# Patient Record
Sex: Female | Born: 1940 | Race: White | Hispanic: No | Smoking: Former smoker
Health system: Southern US, Community
[De-identification: ages and names within clinical notes are randomized; demographics above are authoritative.]

## PROBLEM LIST (undated history)

## (undated) DIAGNOSIS — F32A Depression, unspecified: Secondary | ICD-10-CM

## (undated) DIAGNOSIS — A4902 Methicillin resistant Staphylococcus aureus infection, unspecified site: Secondary | ICD-10-CM

## (undated) DIAGNOSIS — I219 Acute myocardial infarction, unspecified: Secondary | ICD-10-CM

## (undated) DIAGNOSIS — J449 Chronic obstructive pulmonary disease, unspecified: Secondary | ICD-10-CM

## (undated) DIAGNOSIS — I10 Essential (primary) hypertension: Secondary | ICD-10-CM

## (undated) DIAGNOSIS — K635 Polyp of colon: Secondary | ICD-10-CM

## (undated) DIAGNOSIS — C50919 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: Secondary | ICD-10-CM

## (undated) DIAGNOSIS — E785 Hyperlipidemia, unspecified: Secondary | ICD-10-CM

## (undated) DIAGNOSIS — N289 Disorder of kidney and ureter, unspecified: Secondary | ICD-10-CM

## (undated) DIAGNOSIS — I82409 Acute embolism and thrombosis of unspecified deep veins of unspecified lower extremity: Secondary | ICD-10-CM

## (undated) DIAGNOSIS — F419 Anxiety disorder, unspecified: Secondary | ICD-10-CM

## (undated) DIAGNOSIS — J849 Interstitial pulmonary disease, unspecified: Secondary | ICD-10-CM

## (undated) HISTORY — DX: Interstitial pulmonary disease, unspecified: J84.9

## (undated) HISTORY — PX: ABDOMINAL HYSTERECTOMY: SHX81

## (undated) HISTORY — PX: CHOLECYSTECTOMY: SHX55

## (undated) HISTORY — DX: Hyperlipidemia, unspecified: E78.5

## (undated) HISTORY — DX: 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: C50.919

## (undated) HISTORY — DX: Acute embolism and thrombosis of unspecified deep veins of unspecified lower extremity: I82.409

## (undated) HISTORY — DX: Anxiety disorder, unspecified: F41.9

## (undated) HISTORY — DX: Depression, unspecified: F32.A

## (undated) HISTORY — DX: Chronic obstructive pulmonary disease, unspecified: J44.9

## (undated) HISTORY — PX: APPENDECTOMY: SHX54

## (undated) HISTORY — PX: BREAST LUMPECTOMY: SHX2

## (undated) HISTORY — DX: Disorder of kidney and ureter, unspecified: N28.9

## (undated) HISTORY — DX: Polyp of colon: K63.5

## (undated) HISTORY — DX: Acute myocardial infarction, unspecified: I21.9

## (undated) HISTORY — DX: Essential (primary) hypertension: I10

---

## 2011-09-23 DIAGNOSIS — Z853 Personal history of malignant neoplasm of breast: Secondary | ICD-10-CM | POA: Insufficient documentation

## 2011-09-23 HISTORY — DX: Personal history of malignant neoplasm of breast: Z85.3

## 2012-08-09 DIAGNOSIS — C50919 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: Secondary | ICD-10-CM

## 2012-08-09 HISTORY — DX: 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: C50.919

## 2012-10-22 DIAGNOSIS — M1990 Unspecified osteoarthritis, unspecified site: Secondary | ICD-10-CM | POA: Insufficient documentation

## 2012-10-22 DIAGNOSIS — I10 Essential (primary) hypertension: Secondary | ICD-10-CM | POA: Insufficient documentation

## 2012-10-22 DIAGNOSIS — F09 Unspecified mental disorder due to known physiological condition: Secondary | ICD-10-CM

## 2012-10-22 DIAGNOSIS — E785 Hyperlipidemia, unspecified: Secondary | ICD-10-CM | POA: Insufficient documentation

## 2012-10-22 DIAGNOSIS — K219 Gastro-esophageal reflux disease without esophagitis: Secondary | ICD-10-CM

## 2012-10-22 DIAGNOSIS — R42 Dizziness and giddiness: Secondary | ICD-10-CM | POA: Insufficient documentation

## 2012-10-22 HISTORY — DX: Essential (primary) hypertension: I10

## 2012-10-22 HISTORY — DX: Dizziness and giddiness: R42

## 2012-10-22 HISTORY — DX: Unspecified osteoarthritis, unspecified site: M19.90

## 2012-10-22 HISTORY — DX: Gastro-esophageal reflux disease without esophagitis: K21.9

## 2012-10-22 HISTORY — DX: Unspecified mental disorder due to known physiological condition: F09

## 2012-11-15 DIAGNOSIS — F411 Generalized anxiety disorder: Secondary | ICD-10-CM

## 2012-11-15 HISTORY — DX: Generalized anxiety disorder: F41.1

## 2013-01-11 DIAGNOSIS — R9389 Abnormal findings on diagnostic imaging of other specified body structures: Secondary | ICD-10-CM | POA: Insufficient documentation

## 2013-02-23 DIAGNOSIS — Z889 Allergy status to unspecified drugs, medicaments and biological substances status: Secondary | ICD-10-CM | POA: Insufficient documentation

## 2013-02-23 HISTORY — DX: Allergy status to unspecified drugs, medicaments and biological substances: Z88.9

## 2013-03-02 DIAGNOSIS — J4530 Mild persistent asthma, uncomplicated: Secondary | ICD-10-CM

## 2013-03-02 DIAGNOSIS — J479 Bronchiectasis, uncomplicated: Secondary | ICD-10-CM | POA: Insufficient documentation

## 2013-03-02 DIAGNOSIS — R918 Other nonspecific abnormal finding of lung field: Secondary | ICD-10-CM | POA: Insufficient documentation

## 2013-03-02 DIAGNOSIS — J4540 Moderate persistent asthma, uncomplicated: Secondary | ICD-10-CM | POA: Insufficient documentation

## 2013-03-02 HISTORY — DX: Mild persistent asthma, uncomplicated: J45.30

## 2013-03-02 HISTORY — DX: Other nonspecific abnormal finding of lung field: R91.8

## 2014-02-13 DIAGNOSIS — J383 Other diseases of vocal cords: Secondary | ICD-10-CM | POA: Insufficient documentation

## 2015-05-06 DIAGNOSIS — A6000 Herpesviral infection of urogenital system, unspecified: Secondary | ICD-10-CM

## 2015-05-06 HISTORY — DX: Herpesviral infection of urogenital system, unspecified: A60.00

## 2015-10-08 DIAGNOSIS — N3946 Mixed incontinence: Secondary | ICD-10-CM

## 2015-10-08 HISTORY — DX: Mixed incontinence: N39.46

## 2015-11-07 DIAGNOSIS — R918 Other nonspecific abnormal finding of lung field: Secondary | ICD-10-CM | POA: Insufficient documentation

## 2015-11-07 DIAGNOSIS — J4540 Moderate persistent asthma, uncomplicated: Secondary | ICD-10-CM | POA: Insufficient documentation

## 2016-06-25 DIAGNOSIS — H2512 Age-related nuclear cataract, left eye: Secondary | ICD-10-CM

## 2016-06-25 HISTORY — DX: Age-related nuclear cataract, left eye: H25.12

## 2016-11-19 DIAGNOSIS — Z87440 Personal history of urinary (tract) infections: Secondary | ICD-10-CM | POA: Insufficient documentation

## 2016-11-19 HISTORY — DX: Personal history of urinary (tract) infections: Z87.440

## 2017-01-07 DIAGNOSIS — R296 Repeated falls: Secondary | ICD-10-CM

## 2017-01-07 HISTORY — DX: Repeated falls: R29.6

## 2017-01-13 DIAGNOSIS — Z860100 Personal history of colon polyps, unspecified: Secondary | ICD-10-CM

## 2017-01-13 HISTORY — DX: Personal history of colonic polyps: Z86.010

## 2017-01-13 HISTORY — DX: Personal history of colon polyps, unspecified: Z86.0100

## 2017-02-09 DIAGNOSIS — R350 Frequency of micturition: Secondary | ICD-10-CM | POA: Insufficient documentation

## 2017-02-09 DIAGNOSIS — N816 Rectocele: Secondary | ICD-10-CM | POA: Insufficient documentation

## 2017-02-09 DIAGNOSIS — Z90710 Acquired absence of both cervix and uterus: Secondary | ICD-10-CM

## 2017-02-09 HISTORY — DX: Acquired absence of both cervix and uterus: Z90.710

## 2017-03-04 DIAGNOSIS — G459 Transient cerebral ischemic attack, unspecified: Secondary | ICD-10-CM

## 2017-03-04 DIAGNOSIS — F339 Major depressive disorder, recurrent, unspecified: Secondary | ICD-10-CM | POA: Insufficient documentation

## 2017-03-04 HISTORY — DX: Transient cerebral ischemic attack, unspecified: G45.9

## 2017-05-17 DIAGNOSIS — T887XXA Unspecified adverse effect of drug or medicament, initial encounter: Secondary | ICD-10-CM | POA: Insufficient documentation

## 2017-05-17 HISTORY — DX: Unspecified adverse effect of drug or medicament, initial encounter: T88.7XXA

## 2017-07-21 DIAGNOSIS — Z789 Other specified health status: Secondary | ICD-10-CM | POA: Insufficient documentation

## 2017-07-21 HISTORY — DX: Other specified health status: Z78.9

## 2017-08-24 DIAGNOSIS — E538 Deficiency of other specified B group vitamins: Secondary | ICD-10-CM | POA: Insufficient documentation

## 2017-08-24 HISTORY — DX: Deficiency of other specified B group vitamins: E53.8

## 2017-09-22 DIAGNOSIS — I447 Left bundle-branch block, unspecified: Secondary | ICD-10-CM | POA: Insufficient documentation

## 2017-09-22 HISTORY — DX: Left bundle-branch block, unspecified: I44.7

## 2017-10-04 DIAGNOSIS — R2232 Localized swelling, mass and lump, left upper limb: Secondary | ICD-10-CM | POA: Insufficient documentation

## 2018-01-18 DIAGNOSIS — L989 Disorder of the skin and subcutaneous tissue, unspecified: Secondary | ICD-10-CM | POA: Insufficient documentation

## 2018-05-16 DIAGNOSIS — N301 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: Secondary | ICD-10-CM | POA: Insufficient documentation

## 2018-05-16 HISTORY — DX: 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: N30.10

## 2018-07-14 DIAGNOSIS — R270 Ataxia, unspecified: Secondary | ICD-10-CM | POA: Insufficient documentation

## 2018-12-14 DIAGNOSIS — F4321 Adjustment disorder with depressed mood: Secondary | ICD-10-CM | POA: Insufficient documentation

## 2019-09-20 DIAGNOSIS — K469 Unspecified abdominal hernia without obstruction or gangrene: Secondary | ICD-10-CM

## 2019-09-20 DIAGNOSIS — N819 Female genital prolapse, unspecified: Secondary | ICD-10-CM | POA: Insufficient documentation

## 2019-09-20 HISTORY — DX: Unspecified abdominal hernia without obstruction or gangrene: K46.9

## 2020-01-18 DIAGNOSIS — F330 Major depressive disorder, recurrent, mild: Secondary | ICD-10-CM

## 2020-01-18 DIAGNOSIS — F451 Undifferentiated somatoform disorder: Secondary | ICD-10-CM | POA: Insufficient documentation

## 2020-01-18 HISTORY — DX: Major depressive disorder, recurrent, mild: F33.0

## 2020-01-30 DIAGNOSIS — G609 Hereditary and idiopathic neuropathy, unspecified: Secondary | ICD-10-CM | POA: Insufficient documentation

## 2020-01-30 DIAGNOSIS — Z23 Encounter for immunization: Secondary | ICD-10-CM | POA: Insufficient documentation

## 2020-01-30 HISTORY — DX: Hereditary and idiopathic neuropathy, unspecified: G60.9

## 2020-04-08 ENCOUNTER — Ambulatory Visit (INDEPENDENT_AMBULATORY_CARE_PROVIDER_SITE_OTHER): Payer: Medicare Other | Admitting: Medical-Surgical

## 2020-04-08 ENCOUNTER — Encounter: Payer: Self-pay | Admitting: Medical-Surgical

## 2020-04-08 VITALS — BP 111/71 | HR 77 | Temp 98.3°F | Ht 68.0 in | Wt 176.8 lb

## 2020-04-08 DIAGNOSIS — Z1159 Encounter for screening for other viral diseases: Secondary | ICD-10-CM | POA: Diagnosis not present

## 2020-04-08 DIAGNOSIS — Z7689 Persons encountering health services in other specified circumstances: Secondary | ICD-10-CM

## 2020-04-08 DIAGNOSIS — E538 Deficiency of other specified B group vitamins: Secondary | ICD-10-CM

## 2020-04-08 DIAGNOSIS — I499 Cardiac arrhythmia, unspecified: Secondary | ICD-10-CM

## 2020-04-08 DIAGNOSIS — Z1329 Encounter for screening for other suspected endocrine disorder: Secondary | ICD-10-CM

## 2020-04-08 DIAGNOSIS — E785 Hyperlipidemia, unspecified: Secondary | ICD-10-CM

## 2020-04-08 DIAGNOSIS — F419 Anxiety disorder, unspecified: Secondary | ICD-10-CM | POA: Insufficient documentation

## 2020-04-08 DIAGNOSIS — R7303 Prediabetes: Secondary | ICD-10-CM

## 2020-04-08 DIAGNOSIS — F32A Depression, unspecified: Secondary | ICD-10-CM

## 2020-04-08 DIAGNOSIS — I10 Essential (primary) hypertension: Secondary | ICD-10-CM

## 2020-04-08 DIAGNOSIS — F411 Generalized anxiety disorder: Secondary | ICD-10-CM

## 2020-04-08 DIAGNOSIS — Z8619 Personal history of other infectious and parasitic diseases: Secondary | ICD-10-CM

## 2020-04-08 DIAGNOSIS — R300 Dysuria: Secondary | ICD-10-CM | POA: Diagnosis not present

## 2020-04-08 DIAGNOSIS — Z789 Other specified health status: Secondary | ICD-10-CM

## 2020-04-08 DIAGNOSIS — R296 Repeated falls: Secondary | ICD-10-CM

## 2020-04-08 HISTORY — DX: Personal history of other infectious and parasitic diseases: Z86.19

## 2020-04-08 HISTORY — DX: Cardiac arrhythmia, unspecified: I49.9

## 2020-04-08 HISTORY — DX: Anxiety disorder, unspecified: F41.9

## 2020-04-08 HISTORY — DX: Depression, unspecified: F32.A

## 2020-04-08 HISTORY — DX: Prediabetes: R73.03

## 2020-04-08 LAB — POCT URINALYSIS DIP (CLINITEK)
Bilirubin, UA: NEGATIVE
Blood, UA: NEGATIVE
Glucose, UA: NEGATIVE mg/dL
Ketones, POC UA: NEGATIVE mg/dL
Nitrite, UA: NEGATIVE
POC PROTEIN,UA: NEGATIVE
Spec Grav, UA: >=1.03 — AB (ref 1.010–1.025)
Urobilinogen, UA: 0.2 E.U./dL
pH, UA: 5.5 (ref 5.0–8.0)

## 2020-04-08 MED ORDER — OMEPRAZOLE 40 MG PO CPDR: 40.0000 mg | DELAYED_RELEASE_CAPSULE | Freq: Every day | ORAL | 1 refills | Status: DC

## 2020-04-08 MED ORDER — AMLODIPINE BESYLATE 2.5 MG PO TABS: 2.5000 mg | ORAL_TABLET | Freq: Every day | ORAL | 1 refills | Status: DC

## 2020-04-08 MED ORDER — ALBUTEROL SULFATE HFA 108 (90 BASE) MCG/ACT IN AERS: 2.0000 | INHALATION_SPRAY | RESPIRATORY_TRACT | 5 refills | Status: DC | PRN

## 2020-04-08 NOTE — Progress Notes (Addendum)
New Patient Office Visit  Subjective:  Patient ID: Courtney Cummings, female    DOB: 1941/04/23  Age: 79 y.o. MRN: [id number]  CC:  Chief Complaint  Patient presents with  . Establish Care    HPI Courtney Cummings presents to establish care.   Anxiety/depression- managed by psychiatry. Taking sertraline 25mg  BID and lorazepam 1mg  four times daily as needed. Admits that she usually take the lorazepam as 2mg  BID. Has been on benzos for 15 years.   Urinary concerns- mixed incontinence with some dysuria, urgency/frequency. History of interstitial cystitis. Admits to not drinking enough water.   HTN- taking Amlodipine 2.5mg  daily, tolerating well. Mild lower extremity edema for many years.   Vitamin B12- history of deficiency, now supplementing daily.   HLD/statin intolerance- has tried several statins and was unable to tolerate any of them.   Falls- has had multiple falls in the past 12 months. Drinks 1 glass of wine occasionally (less than once weekly). Has intermittent dizziness. No injuries from recent falls.   Past Medical History:  Diagnosis Date  . Anxiety   . Breast cancer ([place])   . Colon polyps   . COPD (chronic obstructive pulmonary disease) ([place])   . Depression   . DVT (deep venous thrombosis) ([place])   . Heart attack ([place])   . Hyperlipidemia   . Hypertension   . Interstitial lung disease ([place])   . Kidney disease     Past Surgical History:  Procedure Laterality Date  . ABDOMINAL HYSTERECTOMY    . APPENDECTOMY    . BREAST LUMPECTOMY    . CHOLECYSTECTOMY      Family History  Problem Relation Age of Onset  . Hypertension Mother   . Heart attack Mother   . Diabetes Mother   . Stroke Mother   . Ovarian cancer Mother   . Hypertension Father   . Diabetes Father   . Tuberculosis Father   . Hypertension Brother   . Diabetes Brother   . Pulmonary fibrosis Brother   . Diabetes Son   . Hypertension Paternal Aunt   . Hypertension Paternal Uncle   . Hypertension Paternal  Grandmother   . Diabetes Paternal Grandmother   . Hypertension Paternal Grandfather   . Diabetes Paternal Grandfather   . Hypertension Brother   . Diabetes Brother   . Hypertension Brother   . Diabetes Brother     Social History   Socioeconomic History  . Marital status: Widowed    Spouse name: Not on file  . Number of children: Not on file  . Years of education: Not on file  . Highest education level: Not on file  Occupational History  . Not on file  Tobacco Use  . Smoking status: Former Smoker    Packs/day: 0.50    Quit date: 04/08/1990    Years since quitting: 30.0  . Smokeless tobacco: Never Used  Vaping Use  . Vaping Use: Never used  Substance and Sexual Activity  . Alcohol use: Yes    Comment: occasionally  . Drug use: Not on file  . Sexual activity: Not Currently    Birth control/protection: Surgical    Comment: hysterectomy  Other Topics Concern  . Not on file  Social History Narrative  . Not on file   Social Determinants of Health   Financial Resource Strain:   . Difficulty of Paying Living Expenses: Not on file  Food Insecurity:   . Worried About Charity fundraiser in the Last Year: Not on file  .  Ran Out of Food in the Last Year: Not on file  Transportation Needs:   . Lack of Transportation (Medical): Not on file  . Lack of Transportation (Non-Medical): Not on file  Physical Activity:   . Days of Exercise per Week: Not on file  . Minutes of Exercise per Session: Not on file  Stress:   . Feeling of Stress : Not on file  Social Connections:   . Frequency of Communication with Friends and Family: Not on file  . Frequency of Social Gatherings with Friends and Family: Not on file  . Attends Religious Services: Not on file  . Active Member of Clubs or Organizations: Not on file  . Attends Archivist Meetings: Not on file  . Marital Status: Not on file  Intimate Partner Violence:   . Fear of Current or Ex-Partner: Not on file  .  Emotionally Abused: Not on file  . Physically Abused: Not on file  . Sexually Abused: Not on file    ROS Review of Systems  Constitutional: Negative for chills, fatigue, fever and unexpected weight change.  Respiratory: Positive for cough and wheezing. Negative for chest tightness and shortness of breath.   Cardiovascular: Positive for leg swelling. Negative for chest pain and palpitations.  Gastrointestinal: Positive for nausea.  Genitourinary: Positive for dysuria, frequency and urgency. Negative for hematuria.  Neurological: Positive for dizziness.  Psychiatric/Behavioral: Positive for dysphoric mood. Negative for self-injury and suicidal ideas. The patient is nervous/anxious.     Objective:   Today's Vitals: BP 111/71   Pulse 77   Temp 98.3 F (36.8 C) (Oral)   Ht 5\' 8"  (1.727 m)   Wt 176 lb 12.8 oz (80.2 kg)   LMP  (Exact Date)   SpO2 94%   BMI 26.88 kg/m   Physical Exam Vitals reviewed.  Constitutional:      General: She is not in acute distress.    Appearance: Normal appearance.  HENT:     Head: Normocephalic and atraumatic.  Cardiovascular:     Rate and Rhythm: Normal rate and regular rhythm.     Pulses: Normal pulses.     Heart sounds: Normal heart sounds. No murmur heard.  No friction rub. No gallop.   Pulmonary:     Effort: Pulmonary effort is normal. No respiratory distress.     Breath sounds: Normal breath sounds. No wheezing.  Abdominal:     General: Bowel sounds are normal. There is no distension.     Palpations: Abdomen is soft. There is no mass.     Tenderness: There is abdominal tenderness (RUQ, mild to BLQ). There is no guarding or rebound.     Hernia: No hernia is present.  Skin:    General: Skin is warm and dry.  Neurological:     Mental Status: She is alert and oriented to person, place, and time.  Psychiatric:        Mood and Affect: Mood normal.        Behavior: Behavior normal.        Thought Content: Thought content normal.         Judgment: Judgment normal.    Assessment & Plan:   1. Encounter to establish care Reviewed available information and discussed health concerns with patient. Up to date on most preventative care.   2. Need for hepatitis C screening test Discussed screening recommendations. Patient is agreeable so adding to blood work today.  - Hepatitis C antibody  3. B12 deficiency Checking  vitamin B12 level today. Continue daily PO vitamin B12.  - Vitamin B12  4. Essential hypertension Checking CBC and CMP.  - CBC - COMPLETE METABOLIC PANEL WITH GFR  5. Statin intolerance/Hyperlipidemia Checking lipid panel today.  - Lipid panel  6. Prediabetes Checking CMP and Hemoglobin A1c. - COMPLETE METABOLIC PANEL WITH GFR - Hemoglobin A1c  7. Generalized anxiety disorder/depression Managed by psychiatry. Patient will let me know if she needs a referral for a new psychiatry provider.   8. Screening for endocrine disorder Checking TSH. Per patient, had a history of thyroid dysfunction and was told she would need to be on medication for life. Later, she was told she did not need thyroid medication at all.  - TSH  9. Dysuria POCT UA + trace leuks and elevated specific gravity. Sending for culture. Recommend increasing fluids, may also help with dizziness/nausea. Holding on treatment at this time with her history of interstitial cystitis.  - POCT URINALYSIS DIP (CLINITEK) - Urine Culture  10. Recurrent falls Suspect dehydration and high dose benzodiazepine use both play a part in her recurrent falls. Advise dosing Lorazepam no more than 1mg  at a time with preferred use of no more than twice a day. Avoid alcohol intake. Increase PO fluids to a goal of at least 64 ounces daily.   Outpatient Encounter Medications as of 04/08/2020  Medication Sig  . albuterol (VENTOLIN HFA) 108 (90 Base) MCG/ACT inhaler Inhale 2 puffs into the lungs every 4 (four) hours as needed for wheezing or shortness of breath.  Marland Kitchen  amLODipine (NORVASC) 2.5 MG tablet Take 1 tablet (2.5 mg total) by mouth daily.  . Calcium Carbonate Antacid (ALKA-SELTZER ANTACID PO) Take by mouth as needed.  . Cholecalciferol 25 MCG (1000 UT) tablet Take 1,000 Units by mouth daily.  . Cyanocobalamin (VITAMIN B 12 PO) Take 1 tablet by mouth daily.  Marland Kitchen dimenhyDRINATE (DRAMAMINE) 50 MG tablet Take 1 tablet by mouth as needed for nausea.  Mariane Baumgarten Sodium (DSS) 100 MG CAPS Take 1 capsule by mouth as needed.  Marland Kitchen LORazepam (ATIVAN) 1 MG tablet Take 1 mg by mouth 4 (four) times daily as needed.  . Multiple Vitamin (MULTI-VITAMIN) tablet Take 1 tablet by mouth daily.  Marland Kitchen omeprazole (PRILOSEC) 40 MG capsule Take 1 capsule (40 mg total) by mouth daily.  . sertraline (ZOLOFT) 50 MG tablet Take 25 mg by mouth 2 (two) times daily.  . [DISCONTINUED] albuterol (VENTOLIN HFA) 108 (90 Base) MCG/ACT inhaler Inhale 2 puffs into the lungs every 4 (four) hours as needed for wheezing or shortness of breath.  . [DISCONTINUED] amLODipine (NORVASC) 2.5 MG tablet Take 2.5 mg by mouth daily.  . [DISCONTINUED] omeprazole (PRILOSEC) 40 MG capsule Take 1 capsule by mouth daily.   No facility-administered encounter medications on file as of 04/08/2020.    Follow-up: Return in about 3 months (around 07/09/2020) for Chronic disease follow up.   Clearnce Sorrel, DNP, APRN, FNP-BC [facility]

## 2020-04-10 LAB — URINE CULTURE
MICRO NUMBER:: 11236285
Result:: NO GROWTH
SPECIMEN QUALITY:: ADEQUATE

## 2020-04-12 LAB — LIPID PANEL
Cholesterol: 236 mg/dL — ABNORMAL HIGH (ref <200)
HDL: 44 mg/dL — ABNORMAL LOW (ref >=50)
LDL Cholesterol (Calc): 159 mg/dL (calc) — ABNORMAL HIGH
Non-HDL Cholesterol (Calc): 192 mg/dL (calc) — ABNORMAL HIGH (ref <130)
Total CHOL/HDL Ratio: 5.4 (calc) — ABNORMAL HIGH (ref <5.0)
Triglycerides: 173 mg/dL — ABNORMAL HIGH (ref <150)

## 2020-04-12 LAB — HEPATITIS C ANTIBODY
Hepatitis C Ab: NONREACTIVE
SIGNAL TO CUT-OFF: 0.01 (ref <1.00)

## 2020-04-12 LAB — CBC
HCT: 39.7 % (ref 35.0–45.0)
Hemoglobin: 13.6 g/dL (ref 11.7–15.5)
MCH: 30.8 pg (ref 27.0–33.0)
MCHC: 34.3 g/dL (ref 32.0–36.0)
MCV: 90 fL (ref 80.0–100.0)
MPV: 10 fL (ref 7.5–12.5)
Platelets: 271 10*3/uL (ref 140–400)
RBC: 4.41 10*6/uL (ref 3.80–5.10)
RDW: 12.6 % (ref 11.0–15.0)
WBC: 5.9 10*3/uL (ref 3.8–10.8)

## 2020-04-12 LAB — HEMOGLOBIN A1C
Hgb A1c MFr Bld: 6.2 % of total Hgb — ABNORMAL HIGH (ref <5.7)
Mean Plasma Glucose: 131 (calc)
eAG (mmol/L): 7.3 (calc)

## 2020-04-12 LAB — COMPLETE METABOLIC PANEL WITH GFR
AG Ratio: 1.8 (calc) (ref 1.0–2.5)
ALT: 24 U/L (ref 6–29)
AST: 23 U/L (ref 10–35)
Albumin: 4.3 g/dL (ref 3.6–5.1)
Alkaline phosphatase (APISO): 90 U/L (ref 37–153)
BUN/Creatinine Ratio: 14 (calc) (ref 6–22)
BUN: 14 mg/dL (ref 7–25)
CO2: 24 mmol/L (ref 20–32)
Calcium: 9.4 mg/dL (ref 8.6–10.4)
Chloride: 105 mmol/L (ref 98–110)
Creat: 0.97 mg/dL — ABNORMAL HIGH (ref 0.60–0.93)
GFR, Est African American: 65 mL/min/{1.73_m2} (ref >=60)
GFR, Est Non African American: 56 mL/min/{1.73_m2} — ABNORMAL LOW (ref >=60)
Globulin: 2.4 g/dL (calc) (ref 1.9–3.7)
Glucose, Bld: 144 mg/dL — ABNORMAL HIGH (ref 65–99)
Potassium: 4.4 mmol/L (ref 3.5–5.3)
Sodium: 139 mmol/L (ref 135–146)
Total Bilirubin: 0.5 mg/dL (ref 0.2–1.2)
Total Protein: 6.7 g/dL (ref 6.1–8.1)

## 2020-04-12 LAB — TSH: TSH: 2.3 mIU/L (ref 0.40–4.50)

## 2020-04-15 ENCOUNTER — Ambulatory Visit: Payer: Self-pay | Admitting: Medical-Surgical

## 2020-06-06 ENCOUNTER — Encounter: Payer: Self-pay | Admitting: Medical-Surgical

## 2020-06-06 ENCOUNTER — Ambulatory Visit (INDEPENDENT_AMBULATORY_CARE_PROVIDER_SITE_OTHER): Payer: Medicare Other

## 2020-06-06 ENCOUNTER — Ambulatory Visit (INDEPENDENT_AMBULATORY_CARE_PROVIDER_SITE_OTHER): Payer: Medicare Other | Admitting: Medical-Surgical

## 2020-06-06 ENCOUNTER — Other Ambulatory Visit: Payer: Self-pay

## 2020-06-06 VITALS — BP 120/78 | HR 76 | Temp 97.6°F | Ht 68.0 in | Wt 174.2 lb

## 2020-06-06 DIAGNOSIS — R0602 Shortness of breath: Secondary | ICD-10-CM

## 2020-06-06 DIAGNOSIS — R42 Dizziness and giddiness: Secondary | ICD-10-CM

## 2020-06-06 DIAGNOSIS — R300 Dysuria: Secondary | ICD-10-CM | POA: Diagnosis not present

## 2020-06-06 DIAGNOSIS — R531 Weakness: Secondary | ICD-10-CM

## 2020-06-06 LAB — POCT URINALYSIS DIP (CLINITEK)
Bilirubin, UA: NEGATIVE
Blood, UA: NEGATIVE
Glucose, UA: NEGATIVE mg/dL
Ketones, POC UA: NEGATIVE mg/dL
Nitrite, UA: NEGATIVE
POC PROTEIN,UA: NEGATIVE
Spec Grav, UA: 1.015 (ref 1.010–1.025)
Urobilinogen, UA: 0.2 E.U./dL
pH, UA: 5.5 (ref 5.0–8.0)

## 2020-06-06 MED ORDER — MECLIZINE HCL 25 MG PO TABS: 25.0000 mg | ORAL_TABLET | Freq: Three times a day (TID) | ORAL | 3 refills | Status: DC | PRN

## 2020-06-06 NOTE — Progress Notes (Signed)
Subjective:    CC: Possible UTI  HPI: Pleasant 80 year old female accompanied by her daughter presenting for evaluation for possible UTI today.  Notes that since her last visit with me in November she has been getting weaker and more fatigued.  She is having significant issues with worsening shortness of breath and dizziness.  Has had some occasional palpitations but most recently notes that she has had new onset urinary incontinence followed by urinary hesitancy/retention.  She has been drinking lots more liquids since our conversation regarding dehydration.  Drinks water, Gatorade, and Pedialyte on a daily basis.  Last week, she was voiding frequently throughout the day, most of the time incontinent requiring clothing changes.  Now she notes that she is only voiding approximately twice daily.  She does have a history of interstitial cystitis so she has presented today so we can evaluate her urine 9. she is currently taking all of the same medicines as usual but notes she has cut back on taking her Ativan due to overall generalized weakness.  She does have some muscle soreness as well as joint aches and pain.  Family history of rheumatoid arthritis.  Personal history of interstitial lung disease that has not been followed up on since March 2021.  She does use a vest/belt therapy a couple of times per day to help with her lungs.  Notes that she just wants to feel better and figure out what is going on.  She admits she has been under increased stress lately as one of her children is in the hospital and the other has some medical concerns that are being investigated.  I reviewed the past medical history, family history, social history, surgical history, and allergies today and no changes were needed.  Please see the problem list section below in epic for further details.  Past Medical History: Past Medical History:  Diagnosis Date  . Anxiety   . Breast cancer ([place])   . Colon polyps   . COPD (chronic  obstructive pulmonary disease) ([place])   . Depression   . DVT (deep venous thrombosis) ([place])   . Heart attack ([place])   . Hyperlipidemia   . Hypertension   . Interstitial lung disease ([place])   . Kidney disease    Past Surgical History: Past Surgical History:  Procedure Laterality Date  . ABDOMINAL HYSTERECTOMY    . APPENDECTOMY    . BREAST LUMPECTOMY    . CHOLECYSTECTOMY     Social History: Social History   Socioeconomic History  . Marital status: Widowed    Spouse name: Not on file  . Number of children: Not on file  . Years of education: Not on file  . Highest education level: Not on file  Occupational History  . Not on file  Tobacco Use  . Smoking status: Former Smoker    Packs/day: 0.50    Quit date: 04/08/1990    Years since quitting: 30.1  . Smokeless tobacco: Never Used  Vaping Use  . Vaping Use: Never used  Substance and Sexual Activity  . Alcohol use: Yes    Comment: occasionally  . Drug use: Not on file  . Sexual activity: Not Currently    Birth control/protection: Surgical    Comment: hysterectomy  Other Topics Concern  . Not on file  Social History Narrative  . Not on file   Social Determinants of Health   Financial Resource Strain: Not on file  Food Insecurity: Not on file  Transportation Needs: Not on file  Physical Activity: Not on file  Stress: Not on file  Social Connections: Not on file   Family History: Family History  Problem Relation Age of Onset  . Hypertension Mother   . Heart attack Mother   . Diabetes Mother   . Stroke Mother   . Ovarian cancer Mother   . Hypertension Father   . Diabetes Father   . Tuberculosis Father   . Hypertension Brother   . Diabetes Brother   . Pulmonary fibrosis Brother   . Diabetes Son   . Hypertension Paternal Aunt   . Hypertension Paternal Uncle   . Hypertension Paternal Grandmother   . Diabetes Paternal Grandmother   . Hypertension Paternal Grandfather   . Diabetes Paternal Grandfather   .  Hypertension Brother   . Diabetes Brother   . Hypertension Brother   . Diabetes Brother    Allergies: Allergies  Allergen Reactions  . Iodinated Diagnostic Agents Anaphylaxis    Shock and seizures  . Iodine Anaphylaxis    Contrast Media Contrast Media Contrast Media   . Iodine-131 Anaphylaxis  . Levalbuterol Hcl Anaphylaxis and Shortness Of Breath  . Meperidine Hcl Anaphylaxis  . Quinolones Itching and Rash  . Tape Hives  . Tiotropium Shortness Of Breath  . Elemental Sulfur Rash    blisters blisters   . Sulfa Antibiotics Rash and Swelling    blisters   . Sulfasalazine Rash    blisters  . Asmanex (120 Metered Doses) [Mometasone Furoate]   . Statins     Other reaction(s): Other (See Comments) Patient states she had tried two or three and she states it causes her pain and she declines to take statin.   . Valacyclovir     Other reaction(s): GI Upset (intolerance), Other (See Comments)  . Codeine Nausea Only  . Diltiazem Hcl Palpitations    Heart speeds up   . Fluticasone Furoate-Vilanterol Nausea Only    Other reaction(s): Other (See Comments), Tremor (intolerance) Doesn't feel well.  Doesn't feel well.  Doesn't feel well.    . Latex Rash  . Lisinopril Rash  . Pantoprazole Rash   Medications: See med rec.  Review of Systems: See HPI for pertinent positives and negatives.   Objective:    General: Well Developed, well nourished, and in no acute distress.  Neuro: Alert and oriented x3.  Ambulating slowly with a cane, some balance concerns noted. HEENT: Normocephalic, atraumatic.  Skin: Warm and dry. Cardiac: Regular rate and rhythm, no murmurs rubs or gallops, no lower extremity edema.  Respiratory: Clear to auscultation bilaterally. Not using accessory muscles, speaking in full sentences. Abdomen: Soft, nontender LUQ/LLQ/RLQ, mildly tender RUQ, nondistended. Bowel sounds + x 4 quadrants. No HSM appreciated.  Impression and Recommendations:    1.  Dysuria POCT urinalysis with normal specific gravity, negative nitrites, protein, and blood, positive for small amount of leukocytes.  Sending for culture.  With her history of interstitial cystitis, will hold off on antibiotics until culture results are available. - POCT URINALYSIS DIP (CLINITEK) - Urine Culture  2. Weakness/dizziness/shortness of breath Weakness, worsening shortness of breath, and worsening dizziness are concerning in a patient her age.  We will go ahead and do lab work as below.  Getting a chest x-ray to evaluate her interstitial lung disease.  If lab work and x-ray normal, may benefit from cardiac evaluation with EKG, echocardiogram, and cardiology referral.  Lucky Rathke is not helping with her dizziness so we are switching that to meclizine 3 times daily as needed.  She is taking this for and tolerated it well with good results. - CBC w/Diff/Platelet - DG Chest 2 View; Future - Sed Rate (ESR) - C-reactive protein - TSH - CK - COMPLETE METABOLIC PANEL WITH GFR  Return if symptoms worsen or fail to improve. ___________________________________________ Clearnce Sorrel, DNP, APRN, FNP-BC [facility] [place]

## 2020-06-07 LAB — CBC WITH DIFFERENTIAL/PLATELET
Absolute Monocytes: 366 cells/uL (ref 200–950)
Basophils Absolute: 69 cells/uL (ref 0–200)
Basophils Relative: 1 %
Eosinophils Absolute: 97 cells/uL (ref 15–500)
Eosinophils Relative: 1.4 %
HCT: 42.6 % (ref 35.0–45.0)
Hemoglobin: 14.2 g/dL (ref 11.7–15.5)
Lymphs Abs: 2946 cells/uL (ref 850–3900)
MCH: 29.8 pg (ref 27.0–33.0)
MCHC: 33.3 g/dL (ref 32.0–36.0)
MCV: 89.5 fL (ref 80.0–100.0)
MPV: 9.8 fL (ref 7.5–12.5)
Monocytes Relative: 5.3 %
Neutro Abs: 3422 cells/uL (ref 1500–7800)
Neutrophils Relative %: 49.6 %
Platelets: 284 10*3/uL (ref 140–400)
RBC: 4.76 10*6/uL (ref 3.80–5.10)
RDW: 12.7 % (ref 11.0–15.0)
Total Lymphocyte: 42.7 %
WBC: 6.9 10*3/uL (ref 3.8–10.8)

## 2020-06-07 LAB — CK: Total CK: 49 U/L (ref 29–143)

## 2020-06-07 LAB — TSH: TSH: 1.91 mIU/L (ref 0.40–4.50)

## 2020-06-07 LAB — COMPLETE METABOLIC PANEL WITH GFR
AG Ratio: 1.8 (calc) (ref 1.0–2.5)
ALT: 25 U/L (ref 6–29)
AST: 25 U/L (ref 10–35)
Albumin: 4.6 g/dL (ref 3.6–5.1)
Alkaline phosphatase (APISO): 95 U/L (ref 37–153)
BUN/Creatinine Ratio: 17 (calc) (ref 6–22)
BUN: 17 mg/dL (ref 7–25)
CO2: 28 mmol/L (ref 20–32)
Calcium: 9.9 mg/dL (ref 8.6–10.4)
Chloride: 102 mmol/L (ref 98–110)
Creat: 1 mg/dL — ABNORMAL HIGH (ref 0.60–0.93)
GFR, Est African American: 62 mL/min/{1.73_m2} (ref >=60)
GFR, Est Non African American: 54 mL/min/{1.73_m2} — ABNORMAL LOW (ref >=60)
Globulin: 2.5 g/dL (calc) (ref 1.9–3.7)
Glucose, Bld: 105 mg/dL — ABNORMAL HIGH (ref 65–99)
Potassium: 4.7 mmol/L (ref 3.5–5.3)
Sodium: 137 mmol/L (ref 135–146)
Total Bilirubin: 0.5 mg/dL (ref 0.2–1.2)
Total Protein: 7.1 g/dL (ref 6.1–8.1)

## 2020-06-07 LAB — C-REACTIVE PROTEIN: CRP: 2.7 mg/L (ref <8.0)

## 2020-06-07 LAB — SEDIMENTATION RATE: Sed Rate: 11 mm/h (ref 0–30)

## 2020-06-08 LAB — URINE CULTURE
MICRO NUMBER:: 11441534
SPECIMEN QUALITY:: ADEQUATE

## 2020-07-03 ENCOUNTER — Ambulatory Visit (INDEPENDENT_AMBULATORY_CARE_PROVIDER_SITE_OTHER): Payer: Medicare Other | Admitting: Medical-Surgical

## 2020-07-03 ENCOUNTER — Other Ambulatory Visit: Payer: Self-pay

## 2020-07-03 ENCOUNTER — Encounter: Payer: Self-pay | Admitting: Medical-Surgical

## 2020-07-03 VITALS — BP 106/69 | HR 77 | Temp 98.5°F | Ht 68.0 in | Wt 173.1 lb

## 2020-07-03 DIAGNOSIS — M549 Dorsalgia, unspecified: Secondary | ICD-10-CM | POA: Diagnosis not present

## 2020-07-03 DIAGNOSIS — R0609 Other forms of dyspnea: Secondary | ICD-10-CM

## 2020-07-03 DIAGNOSIS — R0600 Dyspnea, unspecified: Secondary | ICD-10-CM | POA: Diagnosis not present

## 2020-07-03 DIAGNOSIS — R634 Abnormal weight loss: Secondary | ICD-10-CM

## 2020-07-03 DIAGNOSIS — R112 Nausea with vomiting, unspecified: Secondary | ICD-10-CM

## 2020-07-03 DIAGNOSIS — R300 Dysuria: Secondary | ICD-10-CM

## 2020-07-03 LAB — POCT URINALYSIS DIP (CLINITEK)
Bilirubin, UA: NEGATIVE
Blood, UA: NEGATIVE
Glucose, UA: NEGATIVE mg/dL
Ketones, POC UA: NEGATIVE mg/dL
Nitrite, UA: NEGATIVE
POC PROTEIN,UA: NEGATIVE
Spec Grav, UA: 1.025 (ref 1.010–1.025)
Urobilinogen, UA: 0.2 E.U./dL
pH, UA: 5.5 (ref 5.0–8.0)

## 2020-07-03 MED ORDER — TRAMADOL HCL 50 MG PO TABS
50.0000 mg | ORAL_TABLET | Freq: Three times a day (TID) | ORAL | 0 refills | Status: AC | PRN
Start: 2020-07-03 — End: 2020-07-08

## 2020-07-03 NOTE — Progress Notes (Signed)
Subjective:    CC: kidney issues  HPI: Pleasant 80 year old female accompanied by her daughter presenting today with a myriad of concerning symptoms. She originally reports BLE and hand swelling, itching, and low back pain that she attributes to kidney issues because she found that list under kidney failure resources on the internet. On review, she does endorse intermittent hand swelling bilaterally as well as continuous foot and ankle swelling that varies in severity but never fully resolves. Her low back pain is in the pelvic/SI regions and radiates up the right midaxillary line to the ribs. She has had a poor appetite and is not eating regularly. She drinks Gatorade and drinks protein shakes but eats very little food. Feels fatigued, shaky, and weak. Has nausea with all of her meals and has diarrhea after eating anything. Has had intermittent chills but no documented fevers. Continues to have SOB on exertion. At night, reports she hears "a noise" in her chest when she is laying down that sounds like a valve opening and closing. History of breast cancer and a long problem list have her worried. Reports she felt this bad once and it was right before they found her cancer.   I reviewed the past medical history, family history, social history, surgical history, and allergies today and no changes were needed.  Please see the problem list section below in epic for further details.  Past Medical History: Past Medical History:  Diagnosis Date  . Anxiety   . Breast cancer ([place])   . Colon polyps   . COPD (chronic obstructive pulmonary disease) ([place])   . Depression   . DVT (deep venous thrombosis) ([place])   . Heart attack ([place])   . Hyperlipidemia   . Hypertension   . Interstitial lung disease ([place])   . Kidney disease    Past Surgical History: Past Surgical History:  Procedure Laterality Date  . ABDOMINAL HYSTERECTOMY    . APPENDECTOMY    . BREAST LUMPECTOMY    . CHOLECYSTECTOMY     Social  History: Social History   Socioeconomic History  . Marital status: Widowed    Spouse name: Not on file  . Number of children: Not on file  . Years of education: Not on file  . Highest education level: Not on file  Occupational History  . Not on file  Tobacco Use  . Smoking status: Former Smoker    Packs/day: 0.50    Quit date: 04/08/1990    Years since quitting: 30.2  . Smokeless tobacco: Never Used  Vaping Use  . Vaping Use: Never used  Substance and Sexual Activity  . Alcohol use: Yes    Comment: occasionally  . Drug use: Not on file  . Sexual activity: Not Currently    Birth control/protection: Surgical    Comment: hysterectomy  Other Topics Concern  . Not on file  Social History Narrative  . Not on file   Social Determinants of Health   Financial Resource Strain: Not on file  Food Insecurity: Not on file  Transportation Needs: Not on file  Physical Activity: Not on file  Stress: Not on file  Social Connections: Not on file   Family History: Family History  Problem Relation Age of Onset  . Hypertension Mother   . Heart attack Mother   . Diabetes Mother   . Stroke Mother   . Ovarian cancer Mother   . Hypertension Father   . Diabetes Father   . Tuberculosis Father   . Hypertension Brother   .  Diabetes Brother   . Pulmonary fibrosis Brother   . Diabetes Son   . Hypertension Paternal Aunt   . Hypertension Paternal Uncle   . Hypertension Paternal Grandmother   . Diabetes Paternal Grandmother   . Hypertension Paternal Grandfather   . Diabetes Paternal Grandfather   . Hypertension Brother   . Diabetes Brother   . Hypertension Brother   . Diabetes Brother    Allergies: Allergies  Allergen Reactions  . Iodinated Diagnostic Agents Anaphylaxis    Shock and seizures  . Iodine Anaphylaxis    Contrast Media Contrast Media Contrast Media   . Iodine-131 Anaphylaxis  . Levalbuterol Hcl Anaphylaxis and Shortness Of Breath  . Meperidine Hcl Anaphylaxis   . Quinolones Itching and Rash  . Tape Hives  . Tiotropium Shortness Of Breath  . Asmanex (120 Metered Doses) [Mometasone Furoate]   . Elemental Sulfur Rash    blisters blisters   . Statins     Other reaction(s): Other (See Comments) Patient states she had tried two or three and she states it causes her pain and she declines to take statin.   . Sulfa Antibiotics Rash and Swelling    blisters   . Sulfasalazine Rash    blisters  . Valacyclovir     Other reaction(s): GI Upset (intolerance), Other (See Comments)  . Codeine Nausea Only  . Diltiazem Hcl Palpitations    Heart speeds up   . Fluticasone Furoate-Vilanterol Nausea Only    Other reaction(s): Other (See Comments), Tremor (intolerance) Doesn't feel well.  Doesn't feel well.  Doesn't feel well.    . Latex Rash  . Lisinopril Rash  . Pantoprazole Rash   Medications: See med rec.  Review of Systems: See HPI for pertinent positives and negatives.   Objective:    General: Well Developed, well nourished, pale, and in no acute distress.  Neuro: Alert and oriented x3.  HEENT: Normocephalic, atraumatic.  Skin: Warm and dry. Cardiac: Regular rate and rhythm, no murmurs rubs or gallops, no lower extremity edema.  Respiratory: Clear to auscultation bilaterally with scattered brief "squeaks". Not using accessory muscles, speaking in full sentences.  Impression and Recommendations:    1. Back pain, unspecified back location, unspecified back pain laterality, unspecified chronicity 2. Dysuria POCT UA + small leukocytes, - blood/protein/nitrites. Sending for culture. Checking CMP.  - POCT URINALYSIS DIP (CLINITEK) - Urine Culture - COMPLETE METABOLIC PANEL WITH GFR  3. Dyspnea on exertion Chest x-ray today. Checking BNP. Recent labs showing no concerns with similar symptoms. With history of breast cancer and other systemic symptoms, concern for possible malignancy. Getting CT chest for further evaluation.  - B Nat  Peptide - DG Chest 2 View; Future - CT Chest Wo Contrast; Future  4. Non-intractable vomiting with nausea, unspecified vomiting type CT abdomen pelvis for malignancy workup in the setting of systemic symptoms, unintentional weight loss, etc.  - CT Abdomen Pelvis Wo Contrast; Future  5. Weight loss, unintentional Recent labs obtained reviewed with no explanation. CT chest and abd/pelvis for further workup. Getting images non-contrast due to known kidney issues but if any findings suspicious may need to repeat with contrast.  - CT Chest Wo Contrast; Future  Return if symptoms worsen or fail to improve. ___________________________________________ Clearnce Sorrel, DNP, APRN, FNP-BC [facility] [place]

## 2020-07-04 LAB — COMPLETE METABOLIC PANEL WITH GFR
AG Ratio: 2.2 (calc) (ref 1.0–2.5)
ALT: 25 U/L (ref 6–29)
AST: 23 U/L (ref 10–35)
Albumin: 4.6 g/dL (ref 3.6–5.1)
Alkaline phosphatase (APISO): 90 U/L (ref 37–153)
BUN/Creatinine Ratio: 20 (calc) (ref 6–22)
BUN: 19 mg/dL (ref 7–25)
CO2: 25 mmol/L (ref 20–32)
Calcium: 9.7 mg/dL (ref 8.6–10.4)
Chloride: 105 mmol/L (ref 98–110)
Creat: 0.97 mg/dL — ABNORMAL HIGH (ref 0.60–0.93)
GFR, Est African American: 65 mL/min/{1.73_m2} (ref >=60)
GFR, Est Non African American: 56 mL/min/{1.73_m2} — ABNORMAL LOW (ref >=60)
Globulin: 2.1 g/dL (calc) (ref 1.9–3.7)
Glucose, Bld: 108 mg/dL (ref 65–139)
Potassium: 4.1 mmol/L (ref 3.5–5.3)
Sodium: 140 mmol/L (ref 135–146)
Total Bilirubin: 0.5 mg/dL (ref 0.2–1.2)
Total Protein: 6.7 g/dL (ref 6.1–8.1)

## 2020-07-04 LAB — BRAIN NATRIURETIC PEPTIDE: Brain Natriuretic Peptide: 7 pg/mL (ref <100)

## 2020-07-05 LAB — URINE CULTURE
MICRO NUMBER:: 11544316
Result:: NO GROWTH
SPECIMEN QUALITY:: ADEQUATE

## 2020-07-08 ENCOUNTER — Other Ambulatory Visit: Payer: Medicare Other

## 2020-07-08 ENCOUNTER — Other Ambulatory Visit: Payer: Self-pay

## 2020-07-08 ENCOUNTER — Ambulatory Visit (INDEPENDENT_AMBULATORY_CARE_PROVIDER_SITE_OTHER): Payer: Medicare Other

## 2020-07-08 DIAGNOSIS — I700 Atherosclerosis of aorta: Secondary | ICD-10-CM | POA: Diagnosis not present

## 2020-07-08 DIAGNOSIS — R112 Nausea with vomiting, unspecified: Secondary | ICD-10-CM

## 2020-07-08 DIAGNOSIS — R0600 Dyspnea, unspecified: Secondary | ICD-10-CM

## 2020-07-08 DIAGNOSIS — R0609 Other forms of dyspnea: Secondary | ICD-10-CM

## 2020-07-08 DIAGNOSIS — R911 Solitary pulmonary nodule: Secondary | ICD-10-CM

## 2020-07-08 DIAGNOSIS — K449 Diaphragmatic hernia without obstruction or gangrene: Secondary | ICD-10-CM

## 2020-07-08 DIAGNOSIS — R634 Abnormal weight loss: Secondary | ICD-10-CM

## 2020-07-09 ENCOUNTER — Ambulatory Visit: Payer: Medicare Other | Admitting: Medical-Surgical

## 2020-07-09 DIAGNOSIS — I10 Essential (primary) hypertension: Secondary | ICD-10-CM

## 2020-07-09 DIAGNOSIS — F411 Generalized anxiety disorder: Secondary | ICD-10-CM

## 2020-07-09 DIAGNOSIS — R7303 Prediabetes: Secondary | ICD-10-CM

## 2020-07-09 DIAGNOSIS — E785 Hyperlipidemia, unspecified: Secondary | ICD-10-CM

## 2020-08-22 ENCOUNTER — Other Ambulatory Visit: Payer: Self-pay

## 2020-08-22 NOTE — Telephone Encounter (Signed)
Pt is requesting refills of lorazepam 1 mg and sertraline 50 mg. These were prescribed by her previous PCP.

## 2020-08-23 MED ORDER — SERTRALINE HCL 50 MG PO TABS: 25.0000 mg | ORAL_TABLET | Freq: Two times a day (BID) | ORAL | 1 refills | Status: DC

## 2020-08-23 MED ORDER — LORAZEPAM 1 MG PO TABS: 1.0000 mg | ORAL_TABLET | Freq: Two times a day (BID) | ORAL | 0 refills | Status: DC | PRN

## 2020-08-23 NOTE — Telephone Encounter (Signed)
Pt aware of refill response. Pt states she would like to completely stop lorazepam and change to something that would be better for her at her age without as many side effects as lorazepam.

## 2020-08-23 NOTE — Telephone Encounter (Signed)
Dose of Lorazepam up to 4 times daily is not safe for patients at her age. Not recommended to use benzodiazepines in the elderly population due to risk of falls and injuries. Discussed with my supervising MD and the following determined to be an appropriate plan: Sending Lorazepam 1mg  for use 2 times daily as needed. Needs to use very sparingly and only as needed for severe anxiety.

## 2020-08-23 NOTE — Telephone Encounter (Signed)
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 Other anxiety medications are not likely to be as effective but there are a couple that we can try. Also can look to adjust Sertraline dose for better maintenance control.

## 2020-08-27 MED ORDER — BUSPIRONE HCL 5 MG PO TABS: ORAL_TABLET | ORAL | 0 refills | Status: DC

## 2020-08-27 MED ORDER — SERTRALINE HCL 50 MG PO TABS: ORAL_TABLET | ORAL | 1 refills | Status: DC

## 2020-08-27 NOTE — Telephone Encounter (Signed)
Increase Zoloft to 50mg  every morning and 25mg  every evening for 1 week then increase to 50mg  twice daily. BuSpar sent to pharmacy. Work on weaning Lorazepam as previously discussed. Plan to follow up in 4 weeks to see how she is tolerating the weaning and new medication doses.

## 2020-08-27 NOTE — Telephone Encounter (Addendum)
Patient aware of recommendations and Rx's that have been sent to her pharmacy. No further questions or concerns at this time. Call transferred to the front desk for scheduling

## 2020-08-27 NOTE — Telephone Encounter (Signed)
Would recommend weaning off the Lorazepam since there can be withdrawals from the medication if stopped abruptly after so many years. Would reduce to twice daily as needed for 4 weeks then once daily as needed for 4 weeks. In the meantime, we could try BuSpar to see if this is helpful. It will not have the "kick" that Lorazepam does but it is less likely to cause psychological and physical dependence. Would start BuSpar at 5mg  2-3 times daily then increase as needed. If she would like to do this, I will be glad to call it in.   Clearnce Sorrel, DNP, APRN, FNP-BC [facility]

## 2020-08-27 NOTE — Addendum Note (Signed)
Addended by: Ranelle Oyster on: 08/27/2020 03:37 PM   Modules accepted: Orders

## 2020-08-27 NOTE — Telephone Encounter (Signed)
Pt aware of Joy's response regarding discontinuing the lorazepam. Pt states she is willing to increase the dose of sertraline because she has done so in the past. She states that a couple of years ago she was taking 100 mg bid but started cutting it in half because she thought she was on too much med. She would also like to know what the alternatives to lorazepam are because she feels that being on it for so long, she feels having an alternative would be best.

## 2020-08-27 NOTE — Telephone Encounter (Signed)
Pt agreeable to weaning off lorazepam and starting buspirone. Rx has been tee'd up below and ready for review and approval/denial.

## 2020-08-27 NOTE — Addendum Note (Signed)
Addended bySamuel Bouche on: 08/27/2020 04:36 PM   Modules accepted: Orders

## 2020-09-20 DIAGNOSIS — H40053 Ocular hypertension, bilateral: Secondary | ICD-10-CM | POA: Diagnosis not present

## 2020-09-24 ENCOUNTER — Other Ambulatory Visit: Payer: Self-pay

## 2020-09-24 ENCOUNTER — Ambulatory Visit (INDEPENDENT_AMBULATORY_CARE_PROVIDER_SITE_OTHER): Payer: Medicare Other | Admitting: Medical-Surgical

## 2020-09-24 ENCOUNTER — Encounter: Payer: Self-pay | Admitting: Medical-Surgical

## 2020-09-24 VITALS — BP 136/71 | HR 71 | Temp 98.6°F | Ht 68.0 in | Wt 170.0 lb

## 2020-09-24 DIAGNOSIS — R197 Diarrhea, unspecified: Secondary | ICD-10-CM | POA: Diagnosis not present

## 2020-09-24 DIAGNOSIS — R112 Nausea with vomiting, unspecified: Secondary | ICD-10-CM | POA: Diagnosis not present

## 2020-09-24 DIAGNOSIS — F32A Depression, unspecified: Secondary | ICD-10-CM | POA: Diagnosis not present

## 2020-09-24 DIAGNOSIS — I10 Essential (primary) hypertension: Secondary | ICD-10-CM | POA: Diagnosis not present

## 2020-09-24 DIAGNOSIS — F419 Anxiety disorder, unspecified: Secondary | ICD-10-CM | POA: Diagnosis not present

## 2020-09-24 MED ORDER — MECLIZINE HCL 25 MG PO TABS: 25.0000 mg | ORAL_TABLET | Freq: Three times a day (TID) | ORAL | 3 refills | Status: DC | PRN

## 2020-09-24 NOTE — Progress Notes (Signed)
Subjective:    CC: chronic follow up  HPI:  Courtney Cummings 80 year old female presenting today for the following:  Mood- chronic anxiety and depression. Taking Zoloft 25mg  every morning with 25mg  every night, tolerating well without side effects. Taking Lorazepam 0.5mg  BID, cut down from 1mg  BID. Tried BuSpar but had GI side effects and did not tolerate it. Is under quite a bit of stress with her son's care and mental status.   N/V/D- has been going on for about 4 since Azithromycin given by Pulmonology. Having difficulty eating and drinking normally. Drinking Pedialyte and Gatorade to stay hydrated. Eating small amounts of starchy foods. Has had continuous diarrhea, unable to quantify episodes. Pepto bismol not helping. Taking meclizine as needed for nausea and dizziness with good relief. At times, takes Dramamine instead which is helpful.   HTN- taking amlodipine 2.5mg  daily, tolerating well without side effects.  I reviewed the past medical history, family history, social history, surgical history, and allergies today and no changes were needed.  Please see the problem list section below in epic for further details.  Past Medical History: Past Medical History:  Diagnosis Date  . Anxiety   . Breast cancer ([place])   . Colon polyps   . COPD (chronic obstructive pulmonary disease) ([place])   . Depression   . DVT (deep venous thrombosis) ([place])   . Heart attack ([place])   . Hyperlipidemia   . Hypertension   . Interstitial lung disease ([place])   . Kidney disease    Past Surgical History: Past Surgical History:  Procedure Laterality Date  . ABDOMINAL HYSTERECTOMY    . APPENDECTOMY    . BREAST LUMPECTOMY    . CHOLECYSTECTOMY     Social History: Social History   Socioeconomic History  . Marital status: Widowed    Spouse name: Not on file  . Number of children: Not on file  . Years of education: Not on file  . Highest education level: Not on file  Occupational History  . Not on file   Tobacco Use  . Smoking status: Former Smoker    Packs/day: 0.50    Quit date: 04/08/1990    Years since quitting: 30.4  . Smokeless tobacco: Never Used  Vaping Use  . Vaping Use: Never used  Substance and Sexual Activity  . Alcohol use: Yes    Comment: occasionally  . Drug use: Not on file  . Sexual activity: Not Currently    Birth control/protection: Surgical    Comment: hysterectomy  Other Topics Concern  . Not on file  Social History Narrative  . Not on file   Social Determinants of Health   Financial Resource Strain: Not on file  Food Insecurity: Not on file  Transportation Needs: Not on file  Physical Activity: Not on file  Stress: Not on file  Social Connections: Not on file   Family History: Family History  Problem Relation Age of Onset  . Hypertension Mother   . Heart attack Mother   . Diabetes Mother   . Stroke Mother   . Ovarian cancer Mother   . Hypertension Father   . Diabetes Father   . Tuberculosis Father   . Hypertension Brother   . Diabetes Brother   . Pulmonary fibrosis Brother   . Diabetes Son   . Hypertension Paternal Aunt   . Hypertension Paternal Uncle   . Hypertension Paternal Grandmother   . Diabetes Paternal Grandmother   . Hypertension Paternal Grandfather   . Diabetes Paternal Grandfather   .  Hypertension Brother   . Diabetes Brother   . Hypertension Brother   . Diabetes Brother    Allergies: Allergies  Allergen Reactions  . Iodinated Diagnostic Agents Anaphylaxis    Shock and seizures  . Iodine Anaphylaxis    Contrast Media Contrast Media Contrast Media   . Iodine-131 Anaphylaxis  . Levalbuterol Hcl Anaphylaxis and Shortness Of Breath  . Meperidine Hcl Anaphylaxis  . Quinolones Itching and Rash  . Tape Hives  . Tiotropium Shortness Of Breath  . Asmanex (120 Metered Doses) [Mometasone Furoate]   . Azithromycin Diarrhea and Nausea And Vomiting  . Elemental Sulfur Rash    blisters blisters   . Statins     Other  reaction(s): Other (See Comments) Patient states she had tried two or three and she states it causes her pain and she declines to take statin.   . Sulfa Antibiotics Rash and Swelling    blisters   . Sulfasalazine Rash    blisters  . Valacyclovir     Other reaction(s): GI Upset (intolerance), Other (See Comments)  . Codeine Nausea Only  . Diltiazem Hcl Palpitations    Heart speeds up   . Fluticasone Furoate-Vilanterol Nausea Only    Other reaction(s): Other (See Comments), Tremor (intolerance) Doesn't feel well.  Doesn't feel well.  Doesn't feel well.    . Latex Rash  . Lisinopril Rash  . Pantoprazole Rash   Medications: See med rec.  Review of Systems: See HPI for pertinent positives and negatives.   Depression screen [facility] 2/9 09/24/2020 04/08/2020  Decreased Interest 1 2  Down, Depressed, Hopeless 2 2  PHQ - 2 Score 3 4  Altered sleeping 3 2  Tired, decreased energy 3 3  Change in appetite 3 2  Feeling bad or failure about yourself  1 0  Trouble concentrating 0 1  Moving slowly or fidgety/restless 0 1  Suicidal thoughts 0 0  PHQ-9 Score 13 13  Difficult doing work/chores Not difficult at all Somewhat difficult   GAD 7 : Generalized Anxiety Score 09/24/2020 04/08/2020  Nervous, Anxious, on Edge 2 3  Control/stop worrying 2 3  Worry too much - different things 2 3  Trouble relaxing 2 3  Restless 1 1  Easily annoyed or irritable 1 3  Afraid - awful might happen 0 2  Total GAD 7 Score 10 18  Anxiety Difficulty Not difficult at all Somewhat difficult   Objective:    General: Well Developed, well nourished, and in no acute distress.  Neuro: Alert and oriented x3.  HEENT: Normocephalic, atraumatic.  Skin: Warm and dry. Cardiac: Regular rate and rhythm, no murmurs rubs or gallops, no lower extremity edema.  Respiratory: Clear to auscultation bilaterally. Not using accessory muscles, speaking in full sentences.   Impression and Recommendations:    1. Chronic  anxiety 2. Chronic depression Continue Sertraline 25mg  BID and Lorazepam 0.5mg  BID prn anxiety. Recommend trialing BuSpar again once GI issues have improved. Discussed changing doses of medication or contemplating new ones but patient declined stating "At my age, I don't want to change anything or start anything new."  3. Essential hypertension Continue Amlodipine 2.5mg  daily.   4. Nausea vomiting and diarrhea VS stable today so no indication to give IV fluids. Checking stool for C. Diff. Discussed the BRAT diet and small frequent meals. Continue drinking small amounts of liquids frequently to stay hydrated. If negative for C diff, can start Imodium or Lomotil for diarrhea.  - C. difficile GDH and Toxin  A/B  Return in about 3 months (around 12/25/2020) for chronic disease follow up. ___________________________________________ Clearnce Sorrel, DNP, APRN, FNP-BC [facility] [place]

## 2020-09-25 DIAGNOSIS — R197 Diarrhea, unspecified: Secondary | ICD-10-CM | POA: Diagnosis not present

## 2020-09-25 DIAGNOSIS — R112 Nausea with vomiting, unspecified: Secondary | ICD-10-CM | POA: Diagnosis not present

## 2020-09-26 LAB — C. DIFFICILE GDH AND TOXIN A/B
GDH ANTIGEN: NOT DETECTED
MICRO NUMBER:: 11879402
SPECIMEN QUALITY:: ADEQUATE
TOXIN A AND B: NOT DETECTED

## 2020-10-17 DIAGNOSIS — R059 Cough, unspecified: Secondary | ICD-10-CM | POA: Diagnosis not present

## 2020-10-17 DIAGNOSIS — R0602 Shortness of breath: Secondary | ICD-10-CM | POA: Diagnosis not present

## 2020-10-17 DIAGNOSIS — J439 Emphysema, unspecified: Secondary | ICD-10-CM | POA: Diagnosis not present

## 2020-10-21 DIAGNOSIS — J479 Bronchiectasis, uncomplicated: Secondary | ICD-10-CM | POA: Diagnosis not present

## 2020-10-21 DIAGNOSIS — J849 Interstitial pulmonary disease, unspecified: Secondary | ICD-10-CM | POA: Diagnosis not present

## 2020-10-21 DIAGNOSIS — Z87891 Personal history of nicotine dependence: Secondary | ICD-10-CM | POA: Diagnosis not present

## 2020-10-21 DIAGNOSIS — J4530 Mild persistent asthma, uncomplicated: Secondary | ICD-10-CM | POA: Diagnosis not present

## 2020-10-22 ENCOUNTER — Other Ambulatory Visit: Payer: Self-pay | Admitting: Medical-Surgical

## 2020-10-31 ENCOUNTER — Telehealth: Payer: Self-pay

## 2020-10-31 NOTE — Telephone Encounter (Signed)
Pt's daughter called and said that Courtney Cummings has a stomach virus and has not been able to keep food or liquids down for the last week. She said that she has had it herself but her sx have resolved, but Courtney Cummings is not improving. She said that she told her mom that she needed to go to the ER for fluids but she is refusing to go. There was not any appt availability for an appt this afternoon, and even if we did a virtual visit, we would not be able to treat her. I instructed her daughter to take her to the ED for further evaluation and treatment. She verbalized understanding. I shared this information with Joy who was in agreement with my instructions.

## 2020-11-05 DIAGNOSIS — R42 Dizziness and giddiness: Secondary | ICD-10-CM | POA: Diagnosis not present

## 2020-11-05 DIAGNOSIS — R197 Diarrhea, unspecified: Secondary | ICD-10-CM | POA: Diagnosis not present

## 2020-11-05 DIAGNOSIS — R112 Nausea with vomiting, unspecified: Secondary | ICD-10-CM | POA: Diagnosis not present

## 2020-11-05 DIAGNOSIS — R509 Fever, unspecified: Secondary | ICD-10-CM | POA: Diagnosis not present

## 2020-11-05 DIAGNOSIS — K862 Cyst of pancreas: Secondary | ICD-10-CM | POA: Diagnosis not present

## 2020-11-05 DIAGNOSIS — I10 Essential (primary) hypertension: Secondary | ICD-10-CM | POA: Diagnosis not present

## 2020-11-05 DIAGNOSIS — R1084 Generalized abdominal pain: Secondary | ICD-10-CM | POA: Diagnosis not present

## 2020-11-05 DIAGNOSIS — Z20822 Contact with and (suspected) exposure to covid-19: Secondary | ICD-10-CM | POA: Diagnosis not present

## 2020-11-05 DIAGNOSIS — R531 Weakness: Secondary | ICD-10-CM | POA: Diagnosis not present

## 2020-11-05 DIAGNOSIS — A084 Viral intestinal infection, unspecified: Secondary | ICD-10-CM | POA: Diagnosis not present

## 2020-11-06 ENCOUNTER — Telehealth: Payer: Self-pay | Admitting: General Practice

## 2020-11-06 ENCOUNTER — Telehealth: Payer: Self-pay

## 2020-11-06 NOTE — Telephone Encounter (Signed)
Transition Care Management Follow-up Telephone Call Date of discharge and from where: 11/05/20 from [facility]. How have you been since you were released from the hospital? Doing better, no more vomiting but feeling weak. She has been able to keep foods down now but still feeling weak. She went to the ER but was never given any IV fluids or seen. She left after waiting for 5 hours. I recommended her going back to the ER if she was feeling that weak; but patient refused. She stated that she wants to give it a day or two before doing anything else. She did not want to schedule an appointment to see Samuel Bouche, NP at this time.  Any questions or concerns? No  Items Reviewed: Did the pt receive and understand the discharge instructions provided? Yes  Medications obtained and verified? Yes  Other? No  Any new allergies since your discharge? No  Dietary orders reviewed? Yes Do you have support at home? Yes   Home Care and Equipment/Supplies: Were home health services ordered? no   Functional Questionnaire: (I = Independent and D = Dependent) ADLs: I  Bathing/Dressing- I  Meal Prep- I  Eating- I  Maintaining continence- I  Transferring/Ambulation- I  Managing Meds- I  Follow up appointments reviewed:  PCP Hospital f/u appt confirmed? No  Patient said that she will call back to schedule. [facility] f/u appt confirmed? No   Are transportation arrangements needed? No  If their condition worsens, is the pt aware to call PCP or go to the Emergency Dept.? Yes Was the patient provided with contact information for the PCP's office or ED? Yes Was to pt encouraged to call back with questions or concerns? Yes

## 2020-11-06 NOTE — Telephone Encounter (Signed)
FYI: Pt's daughter called and said that Courtney Cummings was taken by ambulance yesterday to [facility] and they were told it was a 3 hour wait. She said that she got her in the car and then took her to the hospital in [facility]. She said that they waited 4.5-5 hours and left without being seen. She states that Courtney Cummings is in need of fluids and wants to see if we can call and make an appt for her to go to the ER for IV fluids. I called her and told her that per verbal recommendation by Joy that her Mom definitely needed IV fluids if she is continuing to have dehydration and weakness. I told her that we are not able make appts for the ER, but she could go to [facility] [place] or [facility] and probably not have such a long wait time. She said that she would take her Mom in the morning over there. I told her that these facilities are open 24 hours a day if she is able to take Courtney Cummings today instead of waiting until the morning. She said she did not know they were open 24 hours and that she will try and get Courtney Cummings motivated enough to take her today, but it may be hard because she is still ma about what happened yesterday. No further questions or concerns at this time.

## 2020-11-07 ENCOUNTER — Other Ambulatory Visit: Payer: Self-pay

## 2020-11-07 ENCOUNTER — Emergency Department (HOSPITAL_BASED_OUTPATIENT_CLINIC_OR_DEPARTMENT_OTHER)
Admission: EM | Admit: 2020-11-07 | Discharge: 2020-11-07 | Disposition: A | Discharge status: Home / Self Care | Payer: Medicare Other | Attending: Emergency Medicine | Admitting: Emergency Medicine

## 2020-11-07 ENCOUNTER — Emergency Department (HOSPITAL_BASED_OUTPATIENT_CLINIC_OR_DEPARTMENT_OTHER): Payer: Medicare Other

## 2020-11-07 ENCOUNTER — Encounter (HOSPITAL_BASED_OUTPATIENT_CLINIC_OR_DEPARTMENT_OTHER): Payer: Self-pay | Admitting: Emergency Medicine

## 2020-11-07 DIAGNOSIS — N189 Chronic kidney disease, unspecified: Secondary | ICD-10-CM | POA: Insufficient documentation

## 2020-11-07 DIAGNOSIS — R197 Diarrhea, unspecified: Secondary | ICD-10-CM | POA: Insufficient documentation

## 2020-11-07 DIAGNOSIS — Z853 Personal history of malignant neoplasm of breast: Secondary | ICD-10-CM | POA: Diagnosis not present

## 2020-11-07 DIAGNOSIS — B9689 Other specified bacterial agents as the cause of diseases classified elsewhere: Secondary | ICD-10-CM | POA: Insufficient documentation

## 2020-11-07 DIAGNOSIS — Z20822 Contact with and (suspected) exposure to covid-19: Secondary | ICD-10-CM | POA: Insufficient documentation

## 2020-11-07 DIAGNOSIS — I129 Hypertensive chronic kidney disease with stage 1 through stage 4 chronic kidney disease, or unspecified chronic kidney disease: Secondary | ICD-10-CM | POA: Insufficient documentation

## 2020-11-07 DIAGNOSIS — J449 Chronic obstructive pulmonary disease, unspecified: Secondary | ICD-10-CM | POA: Insufficient documentation

## 2020-11-07 DIAGNOSIS — J4530 Mild persistent asthma, uncomplicated: Secondary | ICD-10-CM | POA: Diagnosis not present

## 2020-11-07 DIAGNOSIS — R059 Cough, unspecified: Secondary | ICD-10-CM | POA: Insufficient documentation

## 2020-11-07 DIAGNOSIS — Z91040 Latex allergy status: Secondary | ICD-10-CM | POA: Insufficient documentation

## 2020-11-07 DIAGNOSIS — R6883 Chills (without fever): Secondary | ICD-10-CM | POA: Diagnosis not present

## 2020-11-07 DIAGNOSIS — R112 Nausea with vomiting, unspecified: Secondary | ICD-10-CM | POA: Diagnosis not present

## 2020-11-07 DIAGNOSIS — N3 Acute cystitis 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: Secondary | ICD-10-CM | POA: Diagnosis not present

## 2020-11-07 DIAGNOSIS — E860 Dehydration: Secondary | ICD-10-CM | POA: Diagnosis not present

## 2020-11-07 DIAGNOSIS — R1084 Generalized abdominal pain: Secondary | ICD-10-CM | POA: Diagnosis not present

## 2020-11-07 DIAGNOSIS — Z87891 Personal history of nicotine dependence: Secondary | ICD-10-CM | POA: Insufficient documentation

## 2020-11-07 DIAGNOSIS — Z79899 Other long term (current) drug therapy: Secondary | ICD-10-CM | POA: Insufficient documentation

## 2020-11-07 DIAGNOSIS — R509 Fever, unspecified: Secondary | ICD-10-CM | POA: Diagnosis not present

## 2020-11-07 HISTORY — DX: Methicillin resistant Staphylococcus aureus infection, unspecified site: A49.02

## 2020-11-07 LAB — CBC WITH DIFFERENTIAL/PLATELET
Abs Immature Granulocytes: 0.01 10*3/uL (ref 0.00–0.07)
Basophils Absolute: 0 10*3/uL (ref 0.0–0.1)
Basophils Relative: 1 %
Eosinophils Absolute: 0.1 10*3/uL (ref 0.0–0.5)
Eosinophils Relative: 2 %
HCT: 39.2 % (ref 36.0–46.0)
Hemoglobin: 13.6 g/dL (ref 12.0–15.0)
Immature Granulocytes: 0 %
Lymphocytes Relative: 47 %
Lymphs Abs: 2.7 10*3/uL (ref 0.7–4.0)
MCH: 31 pg (ref 26.0–34.0)
MCHC: 34.7 g/dL (ref 30.0–36.0)
MCV: 89.3 fL (ref 80.0–100.0)
Monocytes Absolute: 0.3 10*3/uL (ref 0.1–1.0)
Monocytes Relative: 6 %
Neutro Abs: 2.4 10*3/uL (ref 1.7–7.7)
Neutrophils Relative %: 44 %
Platelets: 215 10*3/uL (ref 150–400)
RBC: 4.39 MIL/uL (ref 3.87–5.11)
RDW: 13.4 % (ref 11.5–15.5)
WBC: 5.5 10*3/uL (ref 4.0–10.5)
nRBC: 0 % (ref 0.0–0.2)

## 2020-11-07 LAB — URINALYSIS, ROUTINE W REFLEX MICROSCOPIC
Bilirubin Urine: NEGATIVE
Glucose, UA: NEGATIVE mg/dL
Ketones, ur: NEGATIVE mg/dL
Nitrite: NEGATIVE
Protein, ur: NEGATIVE mg/dL
Specific Gravity, Urine: 1.01 (ref 1.005–1.030)
pH: 6 (ref 5.0–8.0)

## 2020-11-07 LAB — RESP PANEL BY RT-PCR (FLU A&B, COVID) ARPGX2
Influenza A by PCR: NEGATIVE
Influenza B by PCR: NEGATIVE
SARS Coronavirus 2 by RT PCR: NEGATIVE

## 2020-11-07 LAB — URINALYSIS, MICROSCOPIC (REFLEX)

## 2020-11-07 LAB — COMPREHENSIVE METABOLIC PANEL
ALT: 24 U/L (ref 0–44)
AST: 23 U/L (ref 15–41)
Albumin: 4 g/dL (ref 3.5–5.0)
Alkaline Phosphatase: 82 U/L (ref 38–126)
Anion gap: 7 (ref 5–15)
BUN: 14 mg/dL (ref 8–23)
CO2: 25 mmol/L (ref 22–32)
Calcium: 8.9 mg/dL (ref 8.9–10.3)
Chloride: 106 mmol/L (ref 98–111)
Creatinine, Ser: 0.78 mg/dL (ref 0.44–1.00)
GFR, Estimated: >60 mL/min (ref >60)
Glucose, Bld: 107 mg/dL — ABNORMAL HIGH (ref 70–99)
Potassium: 3.6 mmol/L (ref 3.5–5.1)
Sodium: 138 mmol/L (ref 135–145)
Total Bilirubin: 0.5 mg/dL (ref 0.3–1.2)
Total Protein: 6.8 g/dL (ref 6.5–8.1)

## 2020-11-07 LAB — LACTIC ACID, PLASMA
Lactic Acid, Venous: 0.9 mmol/L (ref 0.5–1.9)
Lactic Acid, Venous: 0.8 mmol/L (ref 0.5–1.9)

## 2020-11-07 LAB — TSH: TSH: 1.115 u[IU]/mL (ref 0.350–4.500)

## 2020-11-07 LAB — LIPASE, BLOOD: Lipase: 41 U/L (ref 11–51)

## 2020-11-07 MED ORDER — ONDANSETRON HCL 4 MG/2ML IJ SOLN
4.0000 mg | Freq: Once | INTRAMUSCULAR | Status: AC
  Administered 2020-11-07: 4 mg via INTRAVENOUS
  Filled 2020-11-07: qty 2

## 2020-11-07 MED ORDER — SODIUM CHLORIDE 0.9 % IV BOLUS
1000.0000 mL | Freq: Once | INTRAVENOUS | Status: AC
  Administered 2020-11-07: 1000 mL via INTRAVENOUS

## 2020-11-07 MED ORDER — FOSFOMYCIN TROMETHAMINE 3 G PO PACK
3.0000 g | PACK | Freq: Once | ORAL | Status: AC
  Administered 2020-11-07: 3 g via ORAL
  Filled 2020-11-07: qty 3

## 2020-11-07 MED ORDER — ONDANSETRON 4 MG PO TBDP: 4.0000 mg | ORAL_TABLET | Freq: Three times a day (TID) | ORAL | 0 refills | Status: DC | PRN

## 2020-11-07 NOTE — ED Provider Notes (Signed)
[facility] EMERGENCY DEPARTMENT Provider Note   CSN: [id number] Arrival date & time: 11/07/20  3419     History Chief Complaint  Patient presents with   Abdominal Pain    Courtney Cummings is a 80 y.o. female.  The history is provided by the patient, a relative and medical records.  Abdominal Pain Pain location:  Generalized Pain quality: aching and cramping   Pain radiates to:  Does not radiate Pain severity:  Moderate Onset quality:  Gradual Duration:  2 weeks Timing:  Constant Progression:  Waxing and waning Chronicity:  New Relieved by:  Nothing Worsened by:  Palpation Ineffective treatments:  None tried Associated symptoms: chills, cough, diarrhea, fatigue and nausea   Associated symptoms: no chest pain, no constipation, no dysuria, no fever, no shortness of breath and no vomiting       Past Medical History:  Diagnosis Date   Anxiety    Breast cancer ([place])    Colon polyps    COPD (chronic obstructive pulmonary disease) (HCC)    Depression    DVT (deep venous thrombosis) (HCC)    Heart attack (HCC)    Hyperlipidemia    Hypertension    Interstitial lung disease (HCC)    Kidney disease    MRSA (methicillin resistant Staphylococcus aureus)     Patient Active Problem List   Diagnosis Date Noted   Chronic anxiety 04/08/2020   Chronic depression 04/08/2020   History of shingles 04/08/2020   Irregular heart rhythm 04/08/2020   Prediabetes 04/08/2020   Idiopathic peripheral neuropathy 01/30/2020   Major depressive disorder, recurrent, 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 05/16/2018   LBBB (left bundle branch block) 09/22/2017   B12 deficiency 08/24/2017   Statin intolerance 07/21/2017   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   Transient ischemic attack (TIA) 03/04/2017   History of hysterectomy 02/09/2017   History of colon polyps 01/13/2017   Recurrent falls 01/07/2017    History of recurrent UTIs 11/19/2016   Nuclear sclerotic cataract of left eye 06/25/2016   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   Asthma, mild persistent 03/02/2013   Other nonspecific abnormal finding of lung field 03/02/2013   Allergy status to unspecified drugs, medicaments and biological substances 02/23/2013   Generalized anxiety disorder 11/15/2012   Cognitive disorder 10/22/2012   Dizziness 10/22/2012   Essential hypertension 10/22/2012   Gastroesophageal reflux disease without esophagitis 10/22/2012   Hyperlipidemia 10/22/2012   Osteoarthrosis 10/22/2012   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 ([place]) 08/09/2012   Personal history of malignant neoplasm of breast 09/23/2011    Past Surgical History:  Procedure Laterality Date   ABDOMINAL HYSTERECTOMY     APPENDECTOMY     BREAST LUMPECTOMY     CHOLECYSTECTOMY       OB History   No obstetric history on file.     Family History  Problem Relation Age of Onset   Hypertension Mother    Heart attack Mother    Diabetes Mother    Stroke Mother    Ovarian cancer Mother    Hypertension Father    Diabetes Father    Tuberculosis Father    Hypertension Brother    Diabetes Brother    Pulmonary fibrosis Brother    Diabetes Son    Hypertension Paternal Aunt    Hypertension Paternal Uncle    Hypertension Paternal Grandmother    Diabetes Paternal Grandmother  Hypertension Paternal Grandfather    Diabetes Paternal Grandfather    Hypertension Brother    Diabetes Brother    Hypertension Brother    Diabetes Brother     Social History   Tobacco Use   Smoking status: Former    Packs/day: 0.50    Pack years: 0.00    Types: Cigarettes    Quit date: 04/08/1990    Years since quitting: 30.6   Smokeless tobacco: Never  Vaping Use   Vaping Use: Never used  Substance Use Topics   Alcohol use: Yes    Comment: occasionally    Home  Medications Prior to Admission medications   Medication Sig Start Date End Date Taking? Authorizing Provider  albuterol (VENTOLIN HFA) 108 (90 Base) MCG/ACT inhaler Inhale 2 puffs into the lungs every 4 (four) hours as needed for wheezing or shortness of breath. 04/08/20   Samuel Bouche, NP  amLODipine (NORVASC) 2.5 MG tablet Take 1 tablet (2.5 mg total) by mouth daily. 04/08/20   Samuel Bouche, NP  busPIRone (BUSPAR) 5 MG tablet TAKE 1 TABLET BY MOUTH 2-3 TIMES DAILY AS NEEDED 08/27/20   Samuel Bouche, NP  Calcium Carbonate Antacid (ALKA-SELTZER ANTACID PO) Take by mouth as needed.    [provider]  LORazepam (ATIVAN) 1 MG tablet Take 1 tablet (1 mg total) by mouth 2 (two) times daily as needed (Use very sparingly). 08/23/20   Samuel Bouche, NP  meclizine (ANTIVERT) 25 MG tablet Take 1 tablet (25 mg total) by mouth 3 (three) times daily as needed for dizziness or nausea. 09/24/20   Samuel Bouche, NP  Multiple Vitamin (MULTI-VITAMIN) tablet Take 1 tablet by mouth daily.    [provider]  omeprazole (PRILOSEC) 40 MG capsule TAKE 1 CAPSULE(40 MG) BY MOUTH DAILY 10/23/20   Samuel Bouche, NP  sertraline (ZOLOFT) 50 MG tablet Take 50mg  (1 tablet) every morning and 25mg  (1/2 tablet) every evening for 1 week then increase to 50mg  twice daily. 08/27/20   Samuel Bouche, NP  traMADol Veatrice Bourbon) 50 MG tablet  09/20/20   [provider]    Allergies    Iodinated diagnostic agents, Iodine, Iodine-131, Levalbuterol hcl, Meperidine hcl, Quinolones, Tape, Tiotropium, Asmanex (120 metered doses) [mometasone furoate], Azithromycin, Elemental sulfur, Statins, Sulfa antibiotics, Sulfasalazine, Valacyclovir, Codeine, Diltiazem hcl, Fluticasone furoate-vilanterol, Latex, Lisinopril, and Pantoprazole  Review of Systems   Review of Systems  Constitutional:  Positive for chills and fatigue. Negative for diaphoresis and fever.  HENT:  Negative for congestion.   Eyes:  Negative for visual disturbance.   Respiratory:  Positive for cough. Negative for chest tightness, shortness of breath and wheezing.   Cardiovascular:  Negative for chest pain, palpitations and leg swelling.  Gastrointestinal:  Positive for abdominal pain, diarrhea and nausea. Negative for abdominal distention, constipation and vomiting.  Genitourinary:  Positive for frequency. Negative for dysuria and flank pain.  Musculoskeletal:  Negative for back pain.  Skin:  Negative for rash and wound.  Neurological:  Negative for dizziness, light-headedness and headaches.  Psychiatric/Behavioral:  Negative for agitation.   All other systems reviewed and are negative.  Physical Exam Updated Vital Signs BP (!) 149/90   Pulse 66   Temp 97.6 F (36.4 C)   Resp 16   Ht 5\' 8"  (1.727 m)   Wt 77.1 kg   SpO2 100%   BMI 25.85 kg/m   Physical Exam Vitals and nursing note reviewed.  Constitutional:      General: She is not in acute distress.  Appearance: She is well-developed. She is not ill-appearing, toxic-appearing or diaphoretic.  HENT:     Head: Normocephalic and atraumatic.     Mouth/Throat:     Mouth: Mucous membranes are moist.     Pharynx: Oropharynx is clear.  Eyes:     Extraocular Movements: Extraocular movements intact.     Conjunctiva/sclera: Conjunctivae normal.     Pupils: Pupils are equal, round, and reactive to light.  Cardiovascular:     Rate and Rhythm: Normal rate and regular rhythm.     Heart sounds: No murmur heard. Pulmonary:     Effort: Pulmonary effort is normal. No respiratory distress.     Breath sounds: Rhonchi present.  Abdominal:     General: Abdomen is flat. Bowel sounds are normal. There is no distension.     Palpations: Abdomen is soft.     Tenderness: There is generalized abdominal tenderness. There is no right CVA tenderness, left CVA tenderness, guarding or rebound.  Musculoskeletal:     Cervical back: Neck supple.  Skin:    General: Skin is warm and dry.     Capillary Refill:  Capillary refill takes less than 2 seconds.  Neurological:     General: No focal deficit present.     Mental Status: She is alert.  Psychiatric:        Mood and Affect: Mood normal.    ED Results / Procedures / Treatments   Labs (all labs ordered are listed, but only abnormal results are displayed) Labs Reviewed - No data to display  EKG None  Radiology No results found.  Procedures Procedures   Medications Ordered in ED Medications - No data to display  ED Course  I have reviewed the triage vital signs and the nursing notes.  Pertinent labs & imaging results that were available during my care of the patient were reviewed by me and considered in my medical decision making (see chart for details).    MDM Rules/Calculators/A&P                          Claramae Mcshea is a 80 y.o. female with a past medical history significant for asthma, anxiety, depression, GERD, hypertension, prior diverticulitis, prior UTIs, interstitial lung disease, breast cancer and previous appendectomy and cholecystectomy who presents with 2 weeks of fatigue, nausea, vomiting, diarrhea, cough, malaise, and abdominal pain.  Patient reports that there have been several people in the family who had GI and URI symptoms recently but the patient has had persistent symptoms for 2 weeks.  She is having near constant nausea, vomiting, diarrhea but she says last 2 days she has been able to tolerate some p.o. which is improved.  Reports abdominal pain is persistent and moderate all across her abdomen.  She does have a history of diverticulitis but is unsure if this feels similar.  She denies any blood in her emesis or stools.  She denies any dysuria but does report the urine has changed in appearance and is more frequent.  She is concerned about possible UTI.  She reports she has not taken her temperature at home but subjectively has fevers and chills and has had a productive cough.  There is phlegm but it is not hemoptysis.   She denies any current chest pain or shortness of breath.  She reportedly went to [facility] yesterday and after a 5-hour wait decided she could not wait longer.  She did have a CT which I  reviewed and did not show any acute findings.  On exam today, patient does have some diffuse abdominal tenderness.  Bowel sounds were appreciated.  No flank or back tenderness.  Lungs did have some mild rhonchi in the bases.  Chest was nontender.  No murmur.  Good pulses and strength and sensation in extremities.  Patient is pale and has dry mucous membranes.  Clinical I suspect she is dehydrated in the setting of the nausea vomiting and diarrhea which could be from a viral illness.  [place] is been 2-week of symptoms, will do a GI pathogen panel and a C. difficile test as well as a COVID/flu test given the similar symptoms in the family.  We will get screening labs and give her some fluids and nausea medicine for symptoms and rehydration.  As she just had a CT yesterday, will not repeat CT scan.  We will get a chest x-ray to look for pneumonia given the productive cough and rhonchi on exam.  Anticipate reassessment to determine disposition.  Work-up returned showing leukocytes and bacteria in the urine.  Given her symptoms, will treat for urinary tract infection.  Other work-up overall reassuring and patient is feeling better after some fluids.  Suspect combination of dehydration as well.  I do suspect she has a viral infection has been going around the family and her COVID and flu are negative.  Patient given a dose of fosfomycin given history of UTIs and reported intolerance to many antibiotics.  We will give a prescription for Zofran ODT as she reports that Zofran helped her significantly.  She will follow-up with her PCP and understands return precautions and follow-up instructions.  She had no other questions or concerns and was discharged in good condition with improved symptoms.    Final Clinical  Impression(s) / ED Diagnoses Final diagnoses:  Generalized abdominal pain  Acute cystitis 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  Nausea vomiting and diarrhea  Dehydration    Rx / DC Orders ED Discharge Orders          Ordered    ondansetron (ZOFRAN ODT) 4 MG disintegrating tablet  Every 8 hours PRN        11/07/20 1525           Clinical Impression: 1. Generalized abdominal pain   2. Acute cystitis 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   3. Nausea vomiting and diarrhea   4. Dehydration     Disposition: Discharge  Condition: Good  I have discussed the results, Dx and Tx plan with the pt(& family if present). He/she/they expressed understanding and agree(s) with the plan. Discharge instructions discussed at great length. Strict return precautions discussed and pt &/or family have verbalized understanding of the instructions. No further questions at time of discharge.    Discharge Medication List as of 11/07/2020  3:25 PM     START taking these medications   Details  ondansetron (ZOFRAN ODT) 4 MG disintegrating tablet Take 1 tablet (4 mg total) by mouth every 8 (eight) hours as needed for nausea or vomiting., Starting Thu 11/07/2020, Print        Follow Up: Samuel Bouche, [place] [address] [place] [address] [id number] RJ JOAC [place]       Travus Oren, Gwenyth Allegra, MD 11/07/20 1807

## 2020-11-07 NOTE — ED Notes (Signed)
Butterfly stick x 2, unsuccessful for labs. RN to use U/S for labs

## 2020-11-07 NOTE — Discharge Instructions (Addendum)
Your work-up today is consistent with urinary tract infection and likely a viral gastroenteritis causing all of her nausea and vomiting, diarrhea, urinary symptoms.  You were clinically dehydrated but your labs are overall reassuring otherwise.  As you feel better after medications and fluids and we feel you are safe for discharge home.  You were given a dose of antibiotics to treat the urinary tract infection.  Please follow-up with your primary doctor.  Please use the nausea medicine to help stay hydrated and rest.  If any symptoms change or worsen acutely, please return to the nearest emergency department.

## 2020-11-07 NOTE — ED Triage Notes (Signed)
Abdominal pain with N/V/D for 2 weeks.  Some chills and sweats.

## 2020-11-08 ENCOUNTER — Telehealth: Payer: Self-pay

## 2020-11-08 NOTE — Telephone Encounter (Signed)
Transition Care Management Follow-up Telephone Call Date of discharge and from where: 11/07/2020 from [facility] How have you been since you were released from the hospital? Pt stated that she is feeling well and is not experiencing pain or nausea.  Any questions or concerns? No  Items Reviewed: Did the pt receive and understand the discharge instructions provided? Yes  Medications obtained and verified? Yes  Other? No  Any new allergies since your discharge? No  Dietary orders reviewed? No Do you have support at home? Yes   Functional Questionnaire: (I = Independent and D = Dependent) ADLs: I  Bathing/Dressing- I  Meal Prep- I  Eating- I  Maintaining continence- I  Transferring/Ambulation- I  Managing Meds- I   Follow up appointments reviewed:  PCP Hospital f/u appt confirmed? Yes  Scheduled to see Samuel Bouche, NP on 12/25/2020 @ 10:10am. [facility] f/u appt confirmed? Yes  Scheduled to see 11/22/2020 on Pulmonology @ 09:50am. Are transportation arrangements needed? No  If their condition worsens, is the pt aware to call PCP or go to the Emergency Dept.? Yes Was the patient provided with contact information for the PCP's office or ED? Yes Was to pt encouraged to call back with questions or concerns? Yes

## 2020-11-09 LAB — URINE CULTURE: Culture: <10000 — AB

## 2020-11-14 DIAGNOSIS — J849 Interstitial pulmonary disease, unspecified: Secondary | ICD-10-CM | POA: Diagnosis not present

## 2020-11-14 DIAGNOSIS — Z87891 Personal history of nicotine dependence: Secondary | ICD-10-CM | POA: Diagnosis not present

## 2020-11-22 DIAGNOSIS — J4530 Mild persistent asthma, uncomplicated: Secondary | ICD-10-CM | POA: Diagnosis not present

## 2020-11-22 DIAGNOSIS — J849 Interstitial pulmonary disease, unspecified: Secondary | ICD-10-CM | POA: Diagnosis not present

## 2020-11-22 DIAGNOSIS — J479 Bronchiectasis, uncomplicated: Secondary | ICD-10-CM | POA: Diagnosis not present

## 2020-12-25 ENCOUNTER — Encounter: Payer: Self-pay | Admitting: Medical-Surgical

## 2020-12-25 ENCOUNTER — Ambulatory Visit (INDEPENDENT_AMBULATORY_CARE_PROVIDER_SITE_OTHER): Payer: Medicare Other | Admitting: Medical-Surgical

## 2020-12-25 VITALS — BP 117/74 | HR 96 | Resp 20 | Wt 170.0 lb

## 2020-12-25 DIAGNOSIS — F32A Depression, unspecified: Secondary | ICD-10-CM | POA: Diagnosis not present

## 2020-12-25 DIAGNOSIS — I10 Essential (primary) hypertension: Secondary | ICD-10-CM | POA: Diagnosis not present

## 2020-12-25 DIAGNOSIS — F419 Anxiety disorder, unspecified: Secondary | ICD-10-CM

## 2020-12-25 MED ORDER — SERTRALINE HCL 50 MG PO TABS: ORAL_TABLET | ORAL | 1 refills | Status: DC

## 2020-12-25 NOTE — Progress Notes (Signed)
  HPI with pertinent ROS:   CC: Mood/hypertension follow-up  HPI: Pleasant 80 year old female presenting today for the following:  Mood-taking sertraline 50 mg daily although some days if she is doing well, she decreases her dose to 25 mg.  She is currently continuing to use Ativan 1 mg twice daily.  She reports requests to be connected with a counselor today as she feels like there are times she needs to be able to verbalize her issues and would like to have someone to talk to.  Most of her stressors revolve around her 2 children who are both bipolar.  Her son is also having significant health care issues and they are trying to find a nursing home to have him placed in.  Hypertension-she does have a blood pressure cuff at home and monitors it periodically.  Taking amlodipine 2.5 mg daily.  Just recently started back on this medication as she had a run of a GI long illness accompanied by severe dehydration that resulted in hospital visit for fluids.  Does note some dizziness, especially with rising quickly from a seated position.  She often has some stumbling and has a history of frequent falls.  She has not had falls and several weeks.  She uses Dramamine and/or meclizine for dizziness.  She is trying to drink plenty of fluids and stay well-hydrated.  She has incorporated a morning cocktail of Gatorade mixed with Pedialyte to maintain an adequate electrolyte balance.  I reviewed the past medical history, family history, social history, surgical history, and allergies today and no changes were needed.  Please see the problem list section below in epic for further details.   Physical exam:   General: Well Developed, well nourished, and in no acute distress.  Neuro: Alert and oriented x3.  HEENT: Normocephalic, atraumatic.  Skin: Warm and dry. Cardiac: Regular rate and rhythm, no murmurs rubs or gallops, no lower extremity edema.  Respiratory: Clear to auscultation bilaterally. Not using accessory  muscles, speaking in full sentences.  Impression and Recommendations:    1. Chronic anxiety 2. Chronic depression. Continue sertraline 50 mg every morning with 50 mg nightly.  Okay to reduce to 20 5 at night if she feels this is necessary.  For now, continue Ativan 1 mg twice daily as needed.  Only uses for severe anxiety and avoid using this for sleep.  Referring to behavioral health for counseling. - sertraline (ZOLOFT) 50 MG tablet; Take '50mg'$  (1 tablet) every morning and '25mg'$  (1/2 tablet) every evening for 1 week then increase to '50mg'$  twice daily.  Dispense: 180 tablet; Refill: 1 - Ambulatory referral to Behavioral Health  3. Essential hypertension Continue amlodipine 2.5 mg daily.  Monitor blood pressure at home and if it continues to run low or she continues to have orthostasis, we may need to reduce her dose to every other day dosing or discontinue the medication altogether.  Continue to stay well-hydrated as this will play into her dizziness and imbalances.  Return in about 3 months (around 03/27/2021) for Mood/hypertension follow-up. ___________________________________________ Clearnce Sorrel, DNP, APRN, FNP-BC [facility] and [place]

## 2021-03-11 ENCOUNTER — Other Ambulatory Visit: Payer: Self-pay

## 2021-03-11 DIAGNOSIS — I10 Essential (primary) hypertension: Secondary | ICD-10-CM

## 2021-03-11 MED ORDER — AMLODIPINE BESYLATE 2.5 MG PO TABS: 2.5000 mg | ORAL_TABLET | Freq: Every day | ORAL | 0 refills | Status: DC

## 2021-03-14 ENCOUNTER — Other Ambulatory Visit: Payer: Self-pay | Admitting: Medical-Surgical

## 2021-03-21 ENCOUNTER — Emergency Department (HOSPITAL_BASED_OUTPATIENT_CLINIC_OR_DEPARTMENT_OTHER)
Admission: EM | Admit: 2021-03-21 | Discharge: 2021-03-21 | Disposition: A | Discharge status: Home / Self Care | Payer: Medicare Other | Attending: Emergency Medicine | Admitting: Emergency Medicine

## 2021-03-21 ENCOUNTER — Emergency Department (HOSPITAL_BASED_OUTPATIENT_CLINIC_OR_DEPARTMENT_OTHER): Payer: Medicare Other

## 2021-03-21 ENCOUNTER — Encounter (HOSPITAL_BASED_OUTPATIENT_CLINIC_OR_DEPARTMENT_OTHER): Payer: Self-pay | Admitting: *Deleted

## 2021-03-21 ENCOUNTER — Other Ambulatory Visit: Payer: Self-pay

## 2021-03-21 DIAGNOSIS — Z87891 Personal history of nicotine dependence: Secondary | ICD-10-CM | POA: Diagnosis not present

## 2021-03-21 DIAGNOSIS — Z853 Personal history of malignant neoplasm of breast: Secondary | ICD-10-CM | POA: Insufficient documentation

## 2021-03-21 DIAGNOSIS — J449 Chronic obstructive pulmonary disease, unspecified: Secondary | ICD-10-CM | POA: Diagnosis not present

## 2021-03-21 DIAGNOSIS — Z79899 Other long term (current) drug therapy: Secondary | ICD-10-CM | POA: Diagnosis not present

## 2021-03-21 DIAGNOSIS — Z8673 Personal history of transient ischemic attack (TIA), and cerebral infarction without residual deficits: Secondary | ICD-10-CM | POA: Insufficient documentation

## 2021-03-21 DIAGNOSIS — J4530 Mild persistent asthma, uncomplicated: Secondary | ICD-10-CM | POA: Diagnosis not present

## 2021-03-21 DIAGNOSIS — N189 Chronic kidney disease, unspecified: Secondary | ICD-10-CM | POA: Diagnosis not present

## 2021-03-21 DIAGNOSIS — H5711 Ocular pain, right eye: Secondary | ICD-10-CM | POA: Diagnosis present

## 2021-03-21 DIAGNOSIS — I129 Hypertensive chronic kidney disease with stage 1 through stage 4 chronic kidney disease, or unspecified chronic kidney disease: Secondary | ICD-10-CM | POA: Insufficient documentation

## 2021-03-21 DIAGNOSIS — Z91040 Latex allergy status: Secondary | ICD-10-CM | POA: Insufficient documentation

## 2021-03-21 DIAGNOSIS — R519 Headache, unspecified: Secondary | ICD-10-CM | POA: Diagnosis not present

## 2021-03-21 LAB — CBC WITH DIFFERENTIAL/PLATELET
Abs Immature Granulocytes: 0.02 10*3/uL (ref 0.00–0.07)
Basophils Absolute: 0 10*3/uL (ref 0.0–0.1)
Basophils Relative: 0 %
Eosinophils Absolute: 0.1 10*3/uL (ref 0.0–0.5)
Eosinophils Relative: 2 %
HCT: 41 % (ref 36.0–46.0)
Hemoglobin: 13.7 g/dL (ref 12.0–15.0)
Immature Granulocytes: 0 %
Lymphocytes Relative: 43 %
Lymphs Abs: 2.9 10*3/uL (ref 0.7–4.0)
MCH: 30.1 pg (ref 26.0–34.0)
MCHC: 33.4 g/dL (ref 30.0–36.0)
MCV: 90.1 fL (ref 80.0–100.0)
Monocytes Absolute: 0.4 10*3/uL (ref 0.1–1.0)
Monocytes Relative: 5 %
Neutro Abs: 3.4 10*3/uL (ref 1.7–7.7)
Neutrophils Relative %: 50 %
Platelets: 228 10*3/uL (ref 150–400)
RBC: 4.55 MIL/uL (ref 3.87–5.11)
RDW: 13.3 % (ref 11.5–15.5)
WBC: 6.7 10*3/uL (ref 4.0–10.5)
nRBC: 0 % (ref 0.0–0.2)

## 2021-03-21 LAB — C-REACTIVE PROTEIN: CRP: 0.5 mg/dL (ref <1.0)

## 2021-03-21 LAB — COMPREHENSIVE METABOLIC PANEL
ALT: 21 U/L (ref 0–44)
AST: 23 U/L (ref 15–41)
Albumin: 4.3 g/dL (ref 3.5–5.0)
Alkaline Phosphatase: 80 U/L (ref 38–126)
Anion gap: 9 (ref 5–15)
BUN: 15 mg/dL (ref 8–23)
CO2: 24 mmol/L (ref 22–32)
Calcium: 9.2 mg/dL (ref 8.9–10.3)
Chloride: 105 mmol/L (ref 98–111)
Creatinine, Ser: 0.92 mg/dL (ref 0.44–1.00)
GFR, Estimated: >60 mL/min (ref >60)
Glucose, Bld: 131 mg/dL — ABNORMAL HIGH (ref 70–99)
Potassium: 3.7 mmol/L (ref 3.5–5.1)
Sodium: 138 mmol/L (ref 135–145)
Total Bilirubin: 0.5 mg/dL (ref 0.3–1.2)
Total Protein: 7.4 g/dL (ref 6.5–8.1)

## 2021-03-21 LAB — SEDIMENTATION RATE: Sed Rate: 14 mm/hr (ref 0–22)

## 2021-03-21 NOTE — ED Triage Notes (Signed)
C/o right eye " pressure " x 3 weeks , sent here by Ophthalmology for r/o GCA

## 2021-03-21 NOTE — Discharge Instructions (Addendum)
Call your primary care doctor for follow-up.  Your eye doctors concern was for giant cell arteritis or temporal arteritis.  This is usually diagnosed by CRP and sedimentation rate.  Head CT done here without any acute findings.  There is other 2 labs were pending.  Make sure your primary care doctor sees these labs.  Return for any new or worse symptoms.  Return per tickly for any visual changes.

## 2021-03-21 NOTE — ED Provider Notes (Addendum)
[facility] EMERGENCY DEPARTMENT Provider Note   CSN: [id number] Arrival date & time: 03/21/21  1447     History Chief Complaint  Patient presents with   Eye Pain    Courtney Cummings is a 80 y.o. female.  Patient sent in by her optometrist.  Patient's been having some right eye pain and pressure in the behind the right eye temporal area for 3 weeks.  Optometrist ruled out any increased ocular pressure.  Had a discussion with ophthalmology on the phone they were concerned about giant cell arteritis or temporal arteritis.  Patient has no visual changes.  Patient does not have a history of any connective tissue disorders.  Patient was specifically sent in for sed rate and C-reactive protein.      Past Medical History:  Diagnosis Date   Anxiety    Breast cancer ([place])    Colon polyps    COPD (chronic obstructive pulmonary disease) ([place])    Depression    DVT (deep venous thrombosis) ([facility])    Heart attack ([place])    Hyperlipidemia    Hypertension    Interstitial lung disease ([place])    Kidney disease    MRSA (methicillin resistant Staphylococcus aureus)     Patient Active Problem List   Diagnosis Date Noted   Chronic anxiety 04/08/2020   Chronic depression 04/08/2020   History of shingles 04/08/2020   Irregular heart rhythm 04/08/2020   Prediabetes 04/08/2020   Idiopathic peripheral neuropathy 01/30/2020   Major depressive disorder, recurrent, 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 05/16/2018   LBBB (left bundle branch block) 09/22/2017   B12 deficiency 08/24/2017   Statin intolerance 07/21/2017   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   Transient ischemic attack (TIA) 03/04/2017   History of hysterectomy 02/09/2017   History of colon polyps 01/13/2017   Recurrent falls 01/07/2017   History of recurrent UTIs 11/19/2016   Nuclear sclerotic cataract of left eye 06/25/2016   Mixed incontinence urge  and stress 10/08/2015   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   Asthma, mild persistent 03/02/2013   Other nonspecific abnormal finding of lung field 03/02/2013   Allergy status to unspecified drugs, medicaments and biological substances 02/23/2013   Generalized anxiety disorder 11/15/2012   Cognitive disorder 10/22/2012   Dizziness 10/22/2012   Essential hypertension 10/22/2012   Gastroesophageal reflux disease without esophagitis 10/22/2012   Hyperlipidemia 10/22/2012   Osteoarthrosis 10/22/2012   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 ([place]) 08/09/2012   Personal history of malignant neoplasm of breast 09/23/2011    Past Surgical History:  Procedure Laterality Date   ABDOMINAL HYSTERECTOMY     APPENDECTOMY     BREAST LUMPECTOMY     CHOLECYSTECTOMY       OB History   No obstetric history on file.     Family History  Problem Relation Age of Onset   Hypertension Mother    Heart attack Mother    Diabetes Mother    Stroke Mother    Ovarian cancer Mother    Hypertension Father    Diabetes Father    Tuberculosis Father    Hypertension Brother    Diabetes Brother    Pulmonary fibrosis Brother    Diabetes Son    Hypertension Paternal Aunt    Hypertension Paternal Uncle    Hypertension Paternal Grandmother    Diabetes Paternal Grandmother    Hypertension Paternal Grandfather    Diabetes Paternal Merchant [employer] officer  Hypertension Brother    Diabetes Brother    Hypertension Brother    Diabetes Brother     Social History   Tobacco Use   Smoking status: Former    Packs/day: 0.50    Types: Cigarettes    Quit date: 04/08/1990    Years since quitting: 30.9   Smokeless tobacco: Never  Vaping Use   Vaping Use: Never used  Substance Use Topics   Alcohol use: Yes    Comment: occasionally    Home Medications Prior to Admission medications   Medication Sig Start Date End Date Taking? Authorizing Provider  albuterol (VENTOLIN HFA) 108  (90 Base) MCG/ACT inhaler Inhale 2 puffs into the lungs every 4 (four) hours as needed for wheezing or shortness of breath. 04/08/20   Samuel Bouche, NP  amLODipine (NORVASC) 2.5 MG tablet Take 1 tablet (2.5 mg total) by mouth daily. 03/11/21   Samuel Bouche, NP  busPIRone (BUSPAR) 5 MG tablet TAKE 1 TABLET BY MOUTH 2-3 TIMES DAILY AS NEEDED Patient not taking: Reported on 12/25/2020 08/27/20   Samuel Bouche, NP  Calcium Carbonate Antacid (ALKA-SELTZER ANTACID PO) Take by mouth as needed.    [provider]  LORazepam (ATIVAN) 1 MG tablet Take 1 tablet (1 mg total) by mouth 2 (two) times daily as needed (Use very sparingly). 08/23/20   Samuel Bouche, NP  meclizine (ANTIVERT) 25 MG tablet Take 1 tablet (25 mg total) by mouth 3 (three) times daily as needed for dizziness or nausea. 09/24/20   Samuel Bouche, NP  Multiple Vitamin (MULTI-VITAMIN) tablet Take 1 tablet by mouth daily.    [provider]  omeprazole (PRILOSEC) 40 MG capsule TAKE 1 CAPSULE(40 MG) BY MOUTH DAILY 03/17/21   Samuel Bouche, NP  ondansetron (ZOFRAN ODT) 4 MG disintegrating tablet Take 1 tablet (4 mg total) by mouth every 8 (eight) hours as needed for nausea or vomiting. 11/07/20   Tegeler, Gwenyth Allegra, MD  sertraline (ZOLOFT) 50 MG tablet Take 50mg  (1 tablet) every morning and 25mg  (1/2 tablet) every evening for 1 week then increase to 50mg  twice daily. 12/25/20   Samuel Bouche, NP  traMADol Veatrice Bourbon) 50 MG tablet  09/20/20   [provider]    Allergies    Diltiazem, Iodinated diagnostic agents, Iodine, Iodine-131, Levalbuterol hcl, Meperidine hcl, Mometasone, Quinolones, Tape, Tiotropium, Umeclidinium, Asmanex (120 metered doses) [mometasone furoate], Azithromycin, Elemental sulfur, Statins, Sulfa antibiotics, Sulfasalazine, Valacyclovir, Codeine, Diltiazem hcl, Fluticasone furoate-vilanterol, Latex, Lisinopril, and Pantoprazole  Review of Systems   Review of Systems  Constitutional:  Negative for chills and fever.   HENT:  Negative for ear pain and sore throat.   Eyes:  Positive for pain. Negative for photophobia, discharge, redness, itching and visual disturbance.  Respiratory:  Negative for cough and shortness of breath.   Cardiovascular:  Negative for chest pain and palpitations.  Gastrointestinal:  Negative for abdominal pain and vomiting.  Genitourinary:  Negative for dysuria and hematuria.  Musculoskeletal:  Negative for arthralgias and back pain.  Skin:  Negative for color change and rash.  Neurological:  Positive for headaches. Negative for seizures and syncope.  All other systems reviewed and are negative.  Physical Exam Updated Vital Signs BP (!) 142/76   Pulse 63   Temp 98 F (36.7 C) (Oral)   Resp 16   Ht 1.727 m (5\' 8" )   Wt 77.1 kg   SpO2 99%   BMI 25.85 kg/m   Physical Exam Vitals and nursing note reviewed.  Constitutional:  General: She is not in acute distress.    Appearance: Normal appearance. She is well-developed.  HENT:     Head: Normocephalic.     Comments: No tenderness to palpation to the right temporal artery area.  No erythema Eyes:     Extraocular Movements: Extraocular movements intact.     Conjunctiva/sclera: Conjunctivae normal.     Pupils: Pupils are equal, round, and reactive to light.  Cardiovascular:     Rate and Rhythm: Normal rate and regular rhythm.     Heart sounds: No murmur heard. Pulmonary:     Effort: Pulmonary effort is normal. No respiratory distress.     Breath sounds: Normal breath sounds.  Abdominal:     Palpations: Abdomen is soft.     Tenderness: There is no abdominal tenderness.  Musculoskeletal:        General: Normal range of motion.     Cervical back: Normal range of motion and neck supple.  Skin:    General: Skin is warm and dry.     Capillary Refill: Capillary refill takes less than 2 seconds.  Neurological:     General: No focal deficit present.     Mental Status: She is alert and oriented to person, place, and  time.     Cranial Nerves: No cranial nerve deficit.     Sensory: No sensory deficit.     Motor: No weakness.    ED Results / Procedures / Treatments   Labs (all labs ordered are listed, but only abnormal results are displayed) Labs Reviewed  CBC WITH DIFFERENTIAL/PLATELET  SEDIMENTATION RATE  C-REACTIVE PROTEIN  COMPREHENSIVE METABOLIC PANEL    EKG None  Radiology CT Head Wo Contrast  Result Date: 03/21/2021 CLINICAL DATA:  Headache, intracranial hemorrhage suspected. Right eye pain/pressure for 3 weeks. EXAM: CT HEAD WITHOUT CONTRAST TECHNIQUE: Contiguous axial images were obtained from the base of the skull through the vertex without intravenous contrast. COMPARISON:  Report from 03/08/2019 head MRI FINDINGS: Brain: There is no evidence of an acute infarct, intracranial hemorrhage, mass, midline shift, or extra-axial fluid collection. Hypodensities in the cerebral white matter bilaterally are nonspecific but compatible with mild-to-moderate chronic small vessel ischemic disease. There is moderate cerebral and cerebellar atrophy. Vascular: Calcified atherosclerosis at the skull base. No hyperdense vessel. Skull: No fracture or suspicious osseous lesion. Sinuses/Orbits: Visualized paranasal sinuses and mastoid air cells are clear. Left cataract extraction. Other: None. IMPRESSION: 1. No evidence of acute intracranial abnormality. 2. Mild-to-moderate chronic small vessel ischemic disease and moderate cerebral atrophy. Electronically Signed   By: Logan Bores M.D.   On: 03/21/2021 17:47    Procedures Procedures   Medications Ordered in ED Medications - No data to display  ED Course  I have reviewed the triage vital signs and the nursing notes.  Pertinent labs & imaging results that were available during my care of the patient were reviewed by me and considered in my medical decision making (see chart for details).    MDM Rules/Calculators/A&P                          Patient  states that she has an allergy to all types of steroids.  Following breast cancer years ago from the chemotherapy.  But says breast cancer is all resolved that was about 16 years ago.  Patient sent in by her her optometrist for concerns for temporal arteritis giant cell arteritis.  Her optometrist already ruled out any intraocular pressure.  Patient does not want a wait for sed rate or C-reactive protein.  BC without any leukocytosis.  Platelets are normal.  Complete metabolic panel is also pending.  CT head without any acute findings.  Patient is followed by [facility].  She states that she will call her primary care doctor on Monday.  Clinically the symptoms been ongoing for 3 weeks.  I would think that if it was going to progress to visual changes it would have done it by now.  But ultimately the diagnosis is more pinned on the CRP and the sed rate.  And usually would result in admission.  But unfortunately patient does not want a wait.  Will not make her sign out AMA.  Since has been ongoing for 3 weeks.  Patient will return for any new or worse symptoms.  Final Clinical Impression(s) / ED Diagnoses Final diagnoses:  Pain of right eye    Rx / DC Orders ED Discharge Orders     None        Fredia Sorrow, MD 03/21/21 1811  Patient's complete metabolic panel without any significant abnormalities.  Patient's sedimentation rate was 14 which is reassuring.  But sometimes folks will have an elevated C-reactive protein, those results are still in process.    Fredia Sorrow, MD 03/21/21 2003  Patient C-reactive protein is still pending.  Patient will be following up with her primary care provider.    Fredia Sorrow, MD 03/21/21 2259

## 2021-03-24 ENCOUNTER — Other Ambulatory Visit: Payer: Self-pay

## 2021-03-24 ENCOUNTER — Ambulatory Visit (INDEPENDENT_AMBULATORY_CARE_PROVIDER_SITE_OTHER): Payer: Medicare Other | Admitting: Physician Assistant

## 2021-03-24 VITALS — BP 137/66 | HR 68 | Ht 68.0 in | Wt 167.0 lb

## 2021-03-24 DIAGNOSIS — Z23 Encounter for immunization: Secondary | ICD-10-CM | POA: Diagnosis not present

## 2021-03-24 DIAGNOSIS — G500 Trigeminal neuralgia: Secondary | ICD-10-CM | POA: Diagnosis not present

## 2021-03-24 DIAGNOSIS — H5711 Ocular pain, right eye: Secondary | ICD-10-CM | POA: Diagnosis not present

## 2021-03-24 MED ORDER — KETOROLAC TROMETHAMINE 60 MG/2ML IM SOLN
60.0000 mg | Freq: Once | INTRAMUSCULAR | Status: AC
  Administered 2021-03-24: 60 mg via INTRAMUSCULAR

## 2021-03-24 MED ORDER — GABAPENTIN 100 MG PO CAPS: 100.0000 mg | ORAL_CAPSULE | Freq: Three times a day (TID) | ORAL | 0 refills | Status: DC

## 2021-03-24 NOTE — Patient Instructions (Addendum)
Trigeminal Neuralgia Trigeminal neuralgia is a nerve disorder that causes severe pain on one side of the face. The pain may last from a few seconds to several minutes, but it can happen hundreds of times a day. The pain is usually only on one side of the face. Symptoms may occur for days, weeks, or months and then go away for months or years. The pain may return and be worse than before. What are the causes? This condition may be caused by: Damage or pressure to a nerve in the head that is called the trigeminal nerve. An attack can be triggered by: Talking or chewing. Putting on makeup. Washing, shaving, or touching your face. Brushing your teeth. Blasts of hot or cold air. Primary demyelinating disorders, such as multiple sclerosis. Tumors. What increases the risk? You are more likely to develop this condition if: You are 50-60 years old. You are female. What are the signs or symptoms? The main symptom of this condition is severe pain in the jaw, lips, eyes, nose, scalp, forehead, and face. How is this diagnosed? This condition is diagnosed with a physical exam. A CT scan or an MRI may be done to rule out other conditions that can cause facial pain. How is this treated? This condition may be treated with: Measures to avoid the things that trigger your symptoms. Prescription medicines such as anticonvulsants. Procedures such as ablation, thermal, or radiation therapy. Cognitive or behavioral therapy. Complementary therapies such as: Gentle, regular exercise or yoga. Meditation. Aromatherapy. Acupuncture. Surgery. This may be done in severe cases if other medical treatment does not provide relief. It may take up to one month for treatment to start relieving the pain. Follow these instructions at home: Managing pain  Learn as much as you can about how to manage your pain. Ask your health care provider if a pain specialist would be helpful. Consider talking with a mental health  care provider about how to cope with the pain. Consider joining a pain support group. General instructions Take over-the-counter and prescription medicines only as told by your health care provider. Avoid the things that trigger your symptoms. It may help to: Chew on the unaffected side of your mouth. Avoid touching your face. Avoid blasts of hot or cold air. Keep all follow-up visits. Where to find more information Facial Pain Association: facepain.org Contact a health care provider if: Your medicine is not helping your symptoms. You have side effects from the medicine used for treatment. You develop new, unexplained symptoms, such as: Double vision. Facial weakness or numbness. Changes in hearing or balance. You feel depressed. Get help right away if: Your pain is severe and is not getting better. You develop suicidal thoughts. If you ever feel like you may hurt yourself or others, or have thoughts about taking your own life, get help right away. Go to your nearest emergency department or: Call your local emergency services (911 in the [place] Call a suicide crisis helpline, such as the National Suicide Prevention Lifeline at 1-800-273-8255 or 988 in the [place] This is open 24 hours a day in the [place] Text the Crisis Text Line at 741741 (in the [place] Summary Trigeminal neuralgia is a nerve disorder that causes severe pain on one side of the face. The pain may last from a few seconds to several minutes. This condition is caused by damage or pressure to a nerve in the head that is called the trigeminal nerve. Treatment may include avoiding the things that trigger your symptoms, taking   medicines, or having procedures or surgery. It may take up to one month for treatment to start relieving the pain. Keep all follow-up visits. This information is not intended to replace advice given to you by your health care provider. Make sure you discuss any questions you have with your health care  provider. Document Revised: 11/28/2020 Document Reviewed: 10/28/2020 Elsevier Patient Education  2022 Elsevier Inc.  

## 2021-03-24 NOTE — Progress Notes (Signed)
Subjective:    Patient ID: Courtney Cummings, female    DOB: [birth year], 80 y.o.   MRN: [id number]  HPI Pt is a 80 yo female, accompanied by daughter, with history of TIA, shingles, neuropathy, anxiety who presents to the clinic to follow up after ED visit on 11/4 to rule out giant cell arteritis. She was seen by her eye doctor for eye pain and pressure for 3 weeks. Opthamology ruled out increased ocular pressure. She is having no visual changes. She went to ED and had CT of head and labs. All were unremarkable and no signs of increased inflammation. It has now been 4 weeks of eye pain and pressure and feels like radiates from temple into and around and behind right eye. "Feels like there is something behind her eye and pushing out". She did have hx of migraines but not had one since she was 80 yo. She has had TIA but denies any speech changes, weakness, vision changes. She has noticed her head has more of a tremor than it used too. Not really tried anything to make better. NSAIDs bother her GI track.      .. 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   Essential hypertension 10/22/2012   Gastroesophageal reflux disease without esophagitis 10/22/2012   Generalized anxiety disorder 11/15/2012   History of colon polyps 01/13/2017   History of hysterectomy 02/09/2017   History of recurrent UTIs 11/19/2016   History of shingles 04/08/2020   Hyperlipidemia 10/22/2012   Idiopathic peripheral neuropathy 01/30/2020   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 05/16/2018   Irregular heart rhythm 04/08/2020   LBBB (left bundle branch block) 09/22/2017   Malignant neoplasm of unspecified site of  unspecified female breast ([place]) 08/09/2012   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   Nuclear sclerotic cataract of left eye 06/25/2016   Osteoarthrosis 10/22/2012   Other nonspecific abnormal finding of lung field 03/02/2013   Personal history of malignant neoplasm of breast 09/23/2011   Recurrent falls 01/07/2017   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   Statin intolerance 07/21/2017   Transient ischemic attack (TIA) 03/04/2017   Prediabetes 04/08/2020   Major depressive disorder, recurrent, 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     Objective:   Physical Exam Vitals reviewed.  Constitutional:      Appearance: Normal appearance. She is obese.  HENT:     Head: Normocephalic.     Nose: Nose normal.  Eyes:     General:  Right eye: No discharge.        Left eye: No discharge.     Extraocular Movements: Extraocular movements intact.     Conjunctiva/sclera: Conjunctivae normal.     Pupils: Pupils are equal, round, and reactive to light.  Pulmonary:     Effort: Pulmonary effort is normal.  Musculoskeletal:     Comments: Some tenderness over right temple to palpation.   Neurological:     General: No focal deficit present.     Mental Status: She is alert and oriented to person, place, and time.     Cranial Nerves: No cranial nerve deficit.     Sensory: No sensory  deficit.     Motor: No weakness.     Coordination: Coordination normal.     Gait: Gait normal.     Deep Tendon Reflexes: Reflexes normal.  Psychiatric:        Mood and Affect: Mood normal.          Assessment & Plan:  Marland KitchenMarland KitchenMyra was seen today for eye problem.  Diagnoses and all orders for this visit:  Trigeminal neuralgia of right side of face -     gabapentin (NEURONTIN) 100 MG capsule; Take 1 capsule (100 mg total) by mouth 3 (three) times daily. -     ketorolac (TORADOL) injection 60 mg -     MR Brain W Wo Contrast; Future  Acute right eye pain -     gabapentin (NEURONTIN) 100 MG capsule; Take 1 capsule (100 mg total) by mouth 3 (three) times daily. -     ketorolac (TORADOL) injection 60 mg -     MR Brain W Wo Contrast; Future  Flu vaccine need -     Flu Vaccine QUAD High Dose(Fluad)   Unclear etiology but seems like trigeminal neuralgia, her pain is certainly in that nerve distrubution. No sign of shingles rash. Labs reviewed and no sign of Giant cell ateritis. No neuro signs to think TIA/Stroke. ? Ocular migraine. No concerns from opthalmology.  Toradol 60mg  IM given today.  Gabapentin to start for shooting nerve pain. Discussed side effects.  Will get MRI.  Follow up in 1 week or with any new symptoms.

## 2021-03-25 ENCOUNTER — Encounter: Payer: Self-pay | Admitting: Physician Assistant

## 2021-03-25 DIAGNOSIS — H5711 Ocular pain, right eye: Secondary | ICD-10-CM | POA: Insufficient documentation

## 2021-03-25 DIAGNOSIS — G500 Trigeminal neuralgia: Secondary | ICD-10-CM | POA: Insufficient documentation

## 2021-03-25 HISTORY — DX: Trigeminal neuralgia: G50.0

## 2021-03-25 HISTORY — DX: Ocular pain, right eye: H57.11

## 2021-03-27 ENCOUNTER — Encounter: Payer: Self-pay | Admitting: *Deleted

## 2021-03-27 ENCOUNTER — Ambulatory Visit (INDEPENDENT_AMBULATORY_CARE_PROVIDER_SITE_OTHER): Payer: Medicare Other | Admitting: Medical-Surgical

## 2021-03-27 ENCOUNTER — Other Ambulatory Visit: Payer: Self-pay

## 2021-03-27 ENCOUNTER — Ambulatory Visit (INDEPENDENT_AMBULATORY_CARE_PROVIDER_SITE_OTHER): Payer: Medicare Other

## 2021-03-27 ENCOUNTER — Encounter: Payer: Self-pay | Admitting: Medical-Surgical

## 2021-03-27 VITALS — BP 148/86 | HR 70 | Resp 20 | Ht 68.0 in | Wt 169.0 lb

## 2021-03-27 DIAGNOSIS — J4530 Mild persistent asthma, uncomplicated: Secondary | ICD-10-CM

## 2021-03-27 DIAGNOSIS — I252 Old myocardial infarction: Secondary | ICD-10-CM

## 2021-03-27 DIAGNOSIS — R051 Acute cough: Secondary | ICD-10-CM

## 2021-03-27 DIAGNOSIS — I10 Essential (primary) hypertension: Secondary | ICD-10-CM | POA: Diagnosis not present

## 2021-03-27 DIAGNOSIS — G500 Trigeminal neuralgia: Secondary | ICD-10-CM

## 2021-03-27 DIAGNOSIS — R609 Edema, unspecified: Secondary | ICD-10-CM

## 2021-03-27 DIAGNOSIS — F419 Anxiety disorder, unspecified: Secondary | ICD-10-CM

## 2021-03-27 DIAGNOSIS — F32A Depression, unspecified: Secondary | ICD-10-CM

## 2021-03-27 DIAGNOSIS — K219 Gastro-esophageal reflux disease without esophagitis: Secondary | ICD-10-CM | POA: Diagnosis not present

## 2021-03-27 DIAGNOSIS — Z853 Personal history of malignant neoplasm of breast: Secondary | ICD-10-CM

## 2021-03-27 NOTE — Progress Notes (Signed)
HPI with pertinent ROS:   CC: Follow up   HPI: Pleasant 80 year old female accompanied by her daughter presenting today for a general follow-up.  She was seen recently in the emergency room on 11/4 for right-sided facial and eye pain.  She has been evaluated by optometry who cannot find an explanation for her symptoms.  She was then seen on 11/7 by Iran Planas, PA who felt that her pain was more related to a trigeminal neuralgia and prescribed her gabapentin.  Today, she notes that she never started the gabapentin and they have not picked it up yet but have plans to do so.  She is very concerned overall due to the continued right-sided facial pain that extends down her neck.  She has also developed some mild swelling that encompasses the entire right side from her face and neck through her right chest, abdomen, arm, leg, and down to her foot.  She continues to have a right-sided headache that has been present for about 1 month.  She is very concerned today as her recent evaluations all involved talk of possible cancer.  She does have a history of breast cancer with bilateral mastectomy and a second surgery to remove lymph nodes.  She was on medication for approximately 5 years but has been off of this for the last 11 years.  She is very worried that there may be a cancerous process that has not been found.  She would like to have a referral to oncology to at least get their opinion and see what their recommendations are.  She also has a history of cardiac issues including arrhythmias and a myocardial infarction she has not cardiology but would like to be evaluated, especially in the setting of her unilateral swelling.  She does have episodes of palpitations and even wakes up at night with her heart pounding at times.  She is unsure is related to her chronic cardiac issue.  Notes that she has gained 6-7 pounds in the last few days.  Checking her blood pressure intermittently at home and notes that it is  very elevated at times with systolics as high as 885.  Reports that today's reading of 148/86 is good for her.  She is taking her amlodipine as prescribed.  I reviewed the past medical history, family history, social history, surgical history, and allergies today and no changes were needed.  Please see the problem list section below in epic for further details.   Physical exam:   General: Well Developed, well nourished, and in no acute distress.  Neuro: Alert and oriented x3.  HEENT: Normocephalic, atraumatic.  Mild nonpitting edema to the right side of the face, right neck. Skin: Warm and dry. Cardiac: Regular rate and rhythm, no murmurs rubs or gallops, mild nonpitting lower extremity edema to the right leg.  Respiratory: Clear to auscultation bilaterally. Not using accessory muscles, speaking in full sentences.  Impression and Recommendations:    1. Essential hypertension Blood pressure slightly above goal today but with her current concerns and issues, we will avoid making changes.  Suspect elevations in blood pressure are related to anxiety and pain.  2. Mild persistent asthma without complication Chest x-ray today.  Continue albuterol as needed. - DG Chest 2 View; Future  3. Gastroesophageal reflux disease without esophagitis Continue Prilosec 40 mg daily.  4. Chronic anxiety 5. Chronic depression Continue Zoloft as prescribed.  6. Acute cough Chest x-ray today. - DG Chest 2 View; Future  7. Personal history of malignant  neoplasm of breast Referring to oncology per patient request. - Ambulatory referral to Hematology / Oncology  8. History of MI (myocardial infarction) Referring to cardiology per patient request. - Ambulatory referral to Cardiology  9.  Swelling Unclear etiology.  We will have her evaluated by cardiology.  Offered a few days of furosemide to help with fluid retention.  She would like to hold off on this until she has been able to talk to oncology.   Advised patient that should her weight continue to increase or should she develop shortness of breath, productive cough, and orthopnea, she should reach out to [place] as soon as possible for further instructions.  10.  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  Start gabapentin as prescribed.  Return in about 3 months (around 06/27/2021) for chronic disease follow up. ___________________________________________ Clearnce Sorrel, DNP, APRN, FNP-BC [facility]

## 2021-03-27 NOTE — Progress Notes (Signed)
Reached out to [employer] to introduce myself as the office RN Navigator and explain our new patient process. Reviewed the reason for their referral and scheduled their new patient appointment along with labs. Provided address and directions to the office including call back phone number. Reviewed with patient any concerns they may have or any possible barriers to attending their appointment.   Patient would like her daughter Courtney Cummings to be aware of this appointment, however doesn't want me to call her. She took my direct number and stated her daughter would call me when she had an opportunity.  Informed patient about my role as a navigator and that I will meet with them prior to their New Patient appointment and more fully discuss what services I can provide. At this time patient has no further questions or needs.    Oncology Nurse Navigator Documentation  Oncology Nurse Navigator Flowsheets 03/27/2021  Navigator Follow Up Date: 04/04/2021  Navigator Follow Up Reason: New Patient Appointment  Navigator Location [facility]  Navigator Encounter Type Introductory Phone Call  Patient Visit Type MedOnc  Treatment Phase Other  Barriers/Navigation Needs Coordination of Care;Education  Education Other  Interventions Coordination of Care;Education  Acuity Level 2-Minimal Needs (1-2 Barriers Identified)  Coordination of Care Appts  Education Method Verbal  Support Groups/Services Friends and Family  Time Spent with Patient 1

## 2021-03-31 ENCOUNTER — Ambulatory Visit (INDEPENDENT_AMBULATORY_CARE_PROVIDER_SITE_OTHER): Payer: Medicare Other

## 2021-03-31 ENCOUNTER — Other Ambulatory Visit: Payer: Self-pay | Admitting: Physician Assistant

## 2021-03-31 ENCOUNTER — Other Ambulatory Visit: Payer: Self-pay

## 2021-03-31 DIAGNOSIS — G500 Trigeminal neuralgia: Secondary | ICD-10-CM

## 2021-03-31 DIAGNOSIS — Z859 Personal history of malignant neoplasm, unspecified: Secondary | ICD-10-CM

## 2021-03-31 DIAGNOSIS — H5711 Ocular pain, right eye: Secondary | ICD-10-CM

## 2021-03-31 DIAGNOSIS — Z853 Personal history of malignant neoplasm of breast: Secondary | ICD-10-CM | POA: Diagnosis not present

## 2021-03-31 MED ORDER — GADOBUTROL 1 MMOL/ML IV SOLN
7.5000 mL | Freq: Once | INTRAVENOUS | Status: AC | PRN
  Administered 2021-03-31: 7.5 mL via INTRAVENOUS

## 2021-04-01 NOTE — Progress Notes (Signed)
I sent you both.

## 2021-04-01 NOTE — Progress Notes (Signed)
I see no reason for you head symptoms to go to oncology.

## 2021-04-01 NOTE — Progress Notes (Signed)
No acute findings with brain MRI. No evidence of compression or mass along the trigeminal nerve. There was some trace fluid in the right mastoid(behind right ear). It is on the effected side and could treat this. We usually use PCN. Have you had any antibiotics lately?

## 2021-04-02 ENCOUNTER — Encounter: Payer: Self-pay | Admitting: Physician Assistant

## 2021-04-02 ENCOUNTER — Telehealth (INDEPENDENT_AMBULATORY_CARE_PROVIDER_SITE_OTHER): Payer: Medicare Other | Admitting: Physician Assistant

## 2021-04-02 DIAGNOSIS — R519 Headache, unspecified: Secondary | ICD-10-CM | POA: Insufficient documentation

## 2021-04-02 DIAGNOSIS — H5711 Ocular pain, right eye: Secondary | ICD-10-CM

## 2021-04-02 DIAGNOSIS — G500 Trigeminal neuralgia: Secondary | ICD-10-CM

## 2021-04-02 HISTORY — DX: Headache, unspecified: R51.9

## 2021-04-02 NOTE — Progress Notes (Signed)
..Virtual Visit via Telephone Note  I connected with Courtney Cummings on 04/02/21 at  9:30 AM EST by telephone and verified that I am speaking with the correct person using two identifiers.  Location: Patient: home Provider: clinic  .Marland KitchenParticipating in visit:  Patient: [name] Provider: Iran Planas PA-C   I discussed the limitations, risks, security and privacy concerns of performing an evaluation and management service by telephone and the availability of in person appointments. I also discussed with the patient that there may be a patient responsible charge related to this service. The patient expressed understanding and agreed to proceed.   History of Present Illness: Pt is a 80 yo female who calls in to discuss results of MRI.   She continues to have right temple, head, eye pain. No pain behind ear or in relationship to mastoid bone. Pt does not tolerate antibiotics very well. She did not stay on gabapentin but she did tolerate it.    FINDINGS: Brain: No acute infarction, hemorrhage, hydrocephalus, extra-axial collection or mass lesion.   Trigeminal nerves: No mass is identified along the course of the trigeminal nerves on thin-slice imaging. No evidence of neurovascular compression.   Vascular: Normal flow voids.   Sinuses/Orbits: The paranasal sinuses are clear. Status post left lens replacement. Trace fluid in the right-greater-than-left mastoid air cells.   Soft tissues: Normal.   Skull and upper cervical spine: Normal marrow signal.   Other: None.   IMPRESSION: 1. No acute intracranial process. 2. No evidence of neurovascular compression or mass along the course of the trigeminal nerves.  .. 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   Essential hypertension 10/22/2012   Gastroesophageal reflux disease without esophagitis 10/22/2012   Generalized anxiety disorder 11/15/2012   History of colon polyps 01/13/2017   History of hysterectomy 02/09/2017   History of recurrent UTIs 11/19/2016   History of shingles 04/08/2020   Hyperlipidemia 10/22/2012   Idiopathic peripheral neuropathy 01/30/2020   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 05/16/2018   Irregular heart rhythm 04/08/2020   LBBB (left bundle branch block) 09/22/2017   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 ([place]) 08/09/2012   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   Nuclear sclerotic cataract of left eye 06/25/2016   Osteoarthrosis 10/22/2012   Other nonspecific abnormal finding of lung field 03/02/2013   Personal history of malignant neoplasm of breast 09/23/2011   Recurrent falls 01/07/2017   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   Statin intolerance 07/21/2017   Transient ischemic attack (TIA) 03/04/2017   Prediabetes 04/08/2020   Major depressive disorder, recurrent, 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   Trigeminal neuralgia of right side of face 03/25/2021   Acute right eye pain 03/25/2021   Right-sided headache 04/02/2021   Resolved Ambulatory Problems    Diagnosis Date Noted   No Resolved Ambulatory Problems   Past Medical History:  Diagnosis Date   Anxiety    Breast cancer ([name])    Colon polyps    COPD (chronic obstructive pulmonary disease) ([place])    Depression    DVT (deep venous thrombosis) ([place])    Heart attack ([place])    Hypertension    Interstitial lung disease (  [place])    Kidney disease    MRSA (methicillin resistant Staphylococcus aureus)     Observations/Objective: No acute distress   Assessment and Plan: Marland KitchenMarland KitchenMyra was seen today for results.  Diagnoses and all  orders for this visit:  Trigeminal neuralgia of right side of face -     Ambulatory referral to Neurology  Acute right eye pain -     Ambulatory referral to Neurology  Right-sided headache  Discuss MRI looks unremarkable except for trace fluid in right mastoid. Pt is not having any issues in this area. I do not think any abx treatment is needed at this time.  Referral to neurology.  Start back gabapentin three times a day if continues to tolerate can increase.    Follow Up Instructions:    I discussed the assessment and treatment plan with the patient. The patient was provided an opportunity to ask questions and all were answered. The patient agreed with the plan and demonstrated an understanding of the instructions.   The patient was advised to call back or seek an in-person evaluation if the symptoms worsen or if the condition fails to improve as anticipated.  I provided 10 minutes of non-face-to-face time during this encounter.   Iran Planas, PA-C

## 2021-04-04 ENCOUNTER — Inpatient Hospital Stay: Payer: Medicare Other | Admitting: Hematology & Oncology

## 2021-04-04 ENCOUNTER — Encounter: Payer: Self-pay | Admitting: Hematology & Oncology

## 2021-04-04 ENCOUNTER — Other Ambulatory Visit: Payer: Self-pay

## 2021-04-04 ENCOUNTER — Inpatient Hospital Stay: Payer: Medicare Other | Attending: Hematology & Oncology

## 2021-04-04 VITALS — BP 138/68 | HR 72 | Temp 97.7°F | Resp 28 | Ht 67.0 in | Wt 171.0 lb

## 2021-04-04 DIAGNOSIS — Z9013 Acquired absence of bilateral breasts and nipples: Secondary | ICD-10-CM | POA: Insufficient documentation

## 2021-04-04 DIAGNOSIS — Z87891 Personal history of nicotine dependence: Secondary | ICD-10-CM | POA: Insufficient documentation

## 2021-04-04 DIAGNOSIS — R5383 Other fatigue: Secondary | ICD-10-CM | POA: Diagnosis not present

## 2021-04-04 DIAGNOSIS — Z9221 Personal history of antineoplastic chemotherapy: Secondary | ICD-10-CM | POA: Insufficient documentation

## 2021-04-04 DIAGNOSIS — E785 Hyperlipidemia, unspecified: Secondary | ICD-10-CM | POA: Insufficient documentation

## 2021-04-04 DIAGNOSIS — C50011 Malignant neoplasm of nipple and areola, right female breast: Secondary | ICD-10-CM

## 2021-04-04 DIAGNOSIS — Z8041 Family history of malignant neoplasm of ovary: Secondary | ICD-10-CM | POA: Diagnosis not present

## 2021-04-04 DIAGNOSIS — Z86718 Personal history of other venous thrombosis and embolism: Secondary | ICD-10-CM | POA: Insufficient documentation

## 2021-04-04 DIAGNOSIS — I10 Essential (primary) hypertension: Secondary | ICD-10-CM | POA: Insufficient documentation

## 2021-04-04 DIAGNOSIS — Z853 Personal history of malignant neoplasm of breast: Secondary | ICD-10-CM | POA: Insufficient documentation

## 2021-04-04 DIAGNOSIS — J849 Interstitial pulmonary disease, unspecified: Secondary | ICD-10-CM | POA: Diagnosis not present

## 2021-04-04 DIAGNOSIS — J449 Chronic obstructive pulmonary disease, unspecified: Secondary | ICD-10-CM | POA: Diagnosis not present

## 2021-04-04 DIAGNOSIS — Z86010 Personal history of colonic polyps: Secondary | ICD-10-CM | POA: Diagnosis not present

## 2021-04-04 LAB — CBC WITH DIFFERENTIAL (CANCER CENTER ONLY)
Abs Immature Granulocytes: 0.02 10*3/uL (ref 0.00–0.07)
Basophils Absolute: 0.1 10*3/uL (ref 0.0–0.1)
Basophils Relative: 1 %
Eosinophils Absolute: 0.1 10*3/uL (ref 0.0–0.5)
Eosinophils Relative: 2 %
HCT: 41.5 % (ref 36.0–46.0)
Hemoglobin: 13.8 g/dL (ref 12.0–15.0)
Immature Granulocytes: 0 %
Lymphocytes Relative: 39 %
Lymphs Abs: 2.8 10*3/uL (ref 0.7–4.0)
MCH: 30.2 pg (ref 26.0–34.0)
MCHC: 33.3 g/dL (ref 30.0–36.0)
MCV: 90.8 fL (ref 80.0–100.0)
Monocytes Absolute: 0.4 10*3/uL (ref 0.1–1.0)
Monocytes Relative: 5 %
Neutro Abs: 3.7 10*3/uL (ref 1.7–7.7)
Neutrophils Relative %: 53 %
Platelet Count: 225 10*3/uL (ref 150–400)
RBC: 4.57 MIL/uL (ref 3.87–5.11)
RDW: 13.4 % (ref 11.5–15.5)
WBC Count: 7 10*3/uL (ref 4.0–10.5)
nRBC: 0 % (ref 0.0–0.2)

## 2021-04-04 LAB — CMP (CANCER CENTER ONLY)
ALT: 23 U/L (ref 0–44)
AST: 21 U/L (ref 15–41)
Albumin: 4.4 g/dL (ref 3.5–5.0)
Alkaline Phosphatase: 83 U/L (ref 38–126)
Anion gap: 8 (ref 5–15)
BUN: 17 mg/dL (ref 8–23)
CO2: 28 mmol/L (ref 22–32)
Calcium: 9.6 mg/dL (ref 8.9–10.3)
Chloride: 105 mmol/L (ref 98–111)
Creatinine: 0.99 mg/dL (ref 0.44–1.00)
GFR, Estimated: 58 mL/min — ABNORMAL LOW (ref >60)
Glucose, Bld: 126 mg/dL — ABNORMAL HIGH (ref 70–99)
Potassium: 4.7 mmol/L (ref 3.5–5.1)
Sodium: 141 mmol/L (ref 135–145)
Total Bilirubin: 0.5 mg/dL (ref 0.3–1.2)
Total Protein: 7.2 g/dL (ref 6.5–8.1)

## 2021-04-04 LAB — LACTATE DEHYDROGENASE: LDH: 154 U/L (ref 98–192)

## 2021-04-04 NOTE — Progress Notes (Signed)
Referral MD  Reason for Referral: History of right breast cancer-stage I versus 2  Chief Complaint  Patient presents with   New Patient (Initial Visit)    "I had breast cancer in the right breast."  : I was told that my breast cancer may have come back.  HPI: Courtney Cummings is a very nice 80 year old white female.  She comes in with her daughter.  She is very delightful to talk to.  She has a history of breast cancer of the right breast.  She says this was about 17 years ago.  We will try to get some records if possible.  She said that she underwent mastectomies.  She has had she has had multiple biopsies prior to this because of benign lesions on her breast.  She said that the tumor in the right breast was 3 cm.  She did have lymph nodes taken out under the arm.  It sounds like there were no lymph nodes that were positive.  She apparently received adjuvant chemotherapy but had a reaction to the initial chemotherapy.  I suspect this may have been Taxotere or Taxol.  She then underwent chemotherapy with what sounds like CAF.  She says she lost her hair.  She says she had treatment for 6 months every 3 weeks.  Because of the chemotherapy, she has had some memory issues.  Recently, she been having some eye issues with the right eye.  She has had some facial swelling.  She apparently was told that this may be because of breast cancer.  She had an MRI of the brain on 03/31/2021.  This was unremarkable.  This did not show anything that looked like a recurrent breast cancer..  She had an MRI of the face also on 03/31/2021.  This also was unremarkable for any trigeminal nerve issue.  She does feel fatigued.  She has little fullness under the right arm.  She says that she was smoking 40 years ago.  She is not have any current alcohol use.  There is no obvious kind of occupational exposures.  She has had past surgeries before.  I think her gallbladder is.  Her appendix is out.  She has a decent  appetite.  She has had no nausea or vomiting.  I think if she has had her last colonoscopy was about a year ago.  Overall, I would say her performance status is probably ECOG 1.  Past Medical History:  Diagnosis Date   Anxiety    Breast cancer ([place])    Colon polyps    COPD (chronic obstructive pulmonary disease) ([facility])    Depression    DVT (deep venous thrombosis) ([facility])    Heart attack ([facility])    Hyperlipidemia    Hypertension    Interstitial lung disease ([facility])    Kidney disease    MRSA (methicillin resistant Staphylococcus aureus)   :   Past Surgical History:  Procedure Laterality Date   ABDOMINAL HYSTERECTOMY     APPENDECTOMY     BREAST LUMPECTOMY     CHOLECYSTECTOMY    :   Current Outpatient Medications:    acetaminophen (TYLENOL) 325 MG tablet, Take 650 mg by mouth every 6 (six) hours as needed., Disp: , Rfl:    albuterol (VENTOLIN HFA) 108 (90 Base) MCG/ACT inhaler, Inhale 2 puffs into the lungs every 4 (four) hours as needed for wheezing or shortness of breath., Disp: 18 g, Rfl: 5   docusate sodium (COLACE) 100 MG capsule, Take 100 mg  by mouth daily as needed for mild constipation., Disp: , Rfl:    gabapentin (NEURONTIN) 100 MG capsule, Take 1 capsule (100 mg total) by mouth 3 (three) times daily. (Patient taking differently: Take 100 mg by mouth 2 (two) times daily.), Disp: 90 capsule, Rfl: 0   LORazepam (ATIVAN) 1 MG tablet, Take 1 tablet (1 mg total) by mouth 2 (two) times daily as needed (Use very sparingly)., Disp: 30 tablet, Rfl: 0   Multiple Vitamin (MULTI-VITAMIN) tablet, Take 1 tablet by mouth daily., Disp: , Rfl:    omeprazole (PRILOSEC) 40 MG capsule, TAKE 1 CAPSULE(40 MG) BY MOUTH DAILY, Disp: 90 capsule, Rfl: 1   sertraline (ZOLOFT) 50 MG tablet, Take 50mg  (1 tablet) every morning and 25mg  (1/2 tablet) every evening for 1 week then increase to 50mg  twice daily., Disp: 180 tablet, Rfl: 1   traMADol (ULTRAM) 50 MG tablet, every 6 (six) hours as needed., Disp: ,  Rfl:    amLODipine (NORVASC) 2.5 MG tablet, Take 1 tablet (2.5 mg total) by mouth daily. (Patient not taking: Reported on 04/04/2021), Disp: 30 tablet, Rfl: 0   busPIRone (BUSPAR) 5 MG tablet, TAKE 1 TABLET BY MOUTH 2-3 TIMES DAILY AS NEEDED (Patient not taking: Reported on 04/04/2021), Disp: 90 tablet, Rfl: 0   Calcium Carbonate Antacid (ALKA-SELTZER ANTACID PO), Take by mouth as needed. (Patient not taking: Reported on 04/04/2021), Disp: , Rfl:    meclizine (ANTIVERT) 25 MG tablet, Take 1 tablet (25 mg total) by mouth 3 (three) times daily as needed for dizziness or nausea. (Patient not taking: Reported on 04/04/2021), Disp: 30 tablet, Rfl: 3   ondansetron (ZOFRAN ODT) 4 MG disintegrating tablet, Take 1 tablet (4 mg total) by mouth every 8 (eight) hours as needed for nausea or vomiting. (Patient not taking: Reported on 04/04/2021), Disp: 16 tablet, Rfl: 0:  :   Allergies  Allergen Reactions   Diltiazem Palpitations   Iodinated Diagnostic Agents Anaphylaxis    Shock and seizures   Iodine Anaphylaxis    Contrast Media   Iodine-131 Anaphylaxis   Levalbuterol Hcl Anaphylaxis and Shortness Of Breath   Meperidine Hcl Anaphylaxis   Mometasone Anaphylaxis   Quinolones Itching and Rash   Tape Hives   Tiotropium Shortness Of Breath   Umeclidinium Palpitations   Asmanex (120 Metered Doses) [Mometasone Furoate]    Azithromycin Diarrhea and Nausea And Vomiting   Elemental Sulfur Rash    blisters   Statins     Patient states she had tried two or three and she states it causes her pain and she declines to take statin.    Sulfa Antibiotics Rash and Swelling    blisters    Sulfasalazine Rash    blisters   Valacyclovir Nausea And Vomiting    GI Upset   Codeine Nausea Only   Diltiazem Hcl Palpitations    Heart speeds up    Fluticasone Furoate-Vilanterol Nausea Only    Doesn't feel well.     Latex Rash   Lisinopril Rash   Pantoprazole Rash  :   Family History  Problem Relation Age  of Onset   Hypertension Mother    Heart attack Mother    Diabetes Mother    Stroke Mother    Ovarian cancer Mother    Hypertension Father    Diabetes Father    Tuberculosis Father    Hypertension Brother    Diabetes Brother    Pulmonary fibrosis Brother    Diabetes Son    Hypertension Paternal Architectural technologist  Hypertension Paternal Uncle    Hypertension Paternal Grandmother    Diabetes Paternal Grandmother    Hypertension Paternal Grandfather    Diabetes Paternal Grandfather    Hypertension Brother    Diabetes Brother    Hypertension Brother    Diabetes Brother   :   Social History   Socioeconomic History   Marital status: Widowed    Spouse name: Not on file   Number of children: Not on file   Years of education: Not on file   Highest education level: Not on file  Occupational History   Not on file  Tobacco Use   Smoking status: Former    Packs/day: 0.50    Types: Cigarettes    Quit date: 04/08/1990    Years since quitting: 31.0   Smokeless tobacco: Never  Vaping Use   Vaping Use: Never used  Substance and Sexual Activity   Alcohol use: Yes    Comment: occasionally   Drug use: Never   Sexual activity: Not Currently    Birth control/protection: Surgical    Comment: hysterectomy  Other Topics Concern   Not on file  Social History Narrative   Not on file   Social Determinants of Health   Financial Resource Strain: Not on file  Food Insecurity: Not on file  Transportation Needs: Not on file  Physical Activity: Not on file  Stress: Not on file  Social Connections: Not on file  Intimate Partner Violence: Not on file  :  Review of Systems  Constitutional:  Positive for malaise/fatigue.  HENT: Negative.    Eyes:  Positive for pain.  Respiratory: Negative.    Cardiovascular: Negative.   Gastrointestinal: Negative.   Genitourinary: Negative.   Musculoskeletal: Negative.   Skin: Negative.   Neurological: Negative.   Endo/Heme/Allergies: Negative.    Psychiatric/Behavioral: Negative.      Exam: @IPVITALS @ This is a fairly well-developed and well-nourished white female in no obvious distress.  Vital signs show temperature of 98.  Pulse 93.  Blood pressure 149/91.  Weight is 171 pounds.  Head and neck exam shows no ocular or oral lesions.  There are no palpable cervical or supraclavicular lymph nodes.  Lungs are clear to percussion and auscultation bilaterally.  Cardiac exam regular rate and rhythm with no murmurs, rubs or bruits.  Abdomen is soft.  She has good bowel sounds.  There is no fluid wave.  There is no palpable liver or spleen tip.  There is no palpable abdominal mass.  She has no adenopathy.  Chest wall exam shows bilateral mastectomies.  There is no chest wall nodularity.  There is no bilateral axillary adenopathy.  There may be a little bit of fullness in the right axilla.  Back exam shows no tenderness over the spine, ribs or hips.  Extremities shows no clubbing, cyanosis or edema.  Neurological exam shows no focal neurological deficits.  Skin exam shows no rashes, ecchymosis or petechia.   Recent Labs    04/04/21 1025  WBC 7.0  HGB 13.8  HCT 41.5  PLT 225    Recent Labs    04/04/21 1025  NA 141  K 4.7  CL 105  CO2 28  GLUCOSE 126*  BUN 17  CREATININE 0.99  CALCIUM 9.6    Blood smear review: None  Pathology: None    Assessment and Plan: Ms. Steelman is a very nice 80 year old white female.  She has a past history of what appears to be an early stage breast cancer.  I forgot to mention that she was on "a pill" for 5 years after chemotherapy.  I have to believe this was tamoxifen.  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  I cannot find any evidence that the cancer has recurred right now.  However, I do realize that with estrogen positive breast cancer, these can recur 20-25 years after initial diagnosis.  I think that the simplest test for [place] to do would be a CT scan of her  chest/abdomen/pelvis.  I think this would tell us if there is any recurrent disease.  If the CT scan is negative, I just cannot think of any other test that we could do that would tell us if there is any recurrence.  I would not think a PET scan would show [place] anything at the CAT scan is negative.  I would not do any tumor markers on her if the CAT scan is negative.  She is very nice.  She comes in with her daughter.  I had a wonderful time with him.  I gave CourtneyWagoner a prayer blanket.  She is very thankful for this.  Hopefully, we will not have to see her back.  However, if there are some issues, then we will definitely get her back.

## 2021-04-07 ENCOUNTER — Encounter: Payer: Self-pay | Admitting: *Deleted

## 2021-04-07 NOTE — Progress Notes (Signed)
Initial RN Navigator Patient Visit  Name: Naliyah Neth Date of Referral : 03/27/2021 Diagnosis: History of Breast Cancer  Patient completed visit with Dr. Marin Olp  Patient will need a CT to assess for cancer recurrence. If negative, no further follow up is needed. CT already scheduled for 04/09/2021.  Oncology Nurse Navigator Documentation  Oncology Nurse Navigator Flowsheets 04/07/2021  Navigator Follow Up Date: 04/09/2021  Navigator Follow Up Reason: Scan Review  Navigator Location [facility]  Referral Date to RadOnc/MedOnc 03/27/2021  Navigator Encounter Type Initial MedOnc  Patient Visit Type MedOnc  Treatment Phase Other  Barriers/Navigation Needs Coordination of Care;Education  Education -  Interventions Coordination of Care;Education  Acuity Level 2-Minimal Needs (1-2 Barriers Identified)  Coordination of Care -  Education Method -  Support Groups/Services Friends and Family  Time Spent with Patient 15

## 2021-04-09 ENCOUNTER — Ambulatory Visit (HOSPITAL_BASED_OUTPATIENT_CLINIC_OR_DEPARTMENT_OTHER)
Admission: RE | Admit: 2021-04-09 | Discharge: 2021-04-09 | Disposition: A | Discharge status: Home / Self Care | Payer: Medicare Other | Source: Ambulatory Visit | Attending: Hematology & Oncology | Admitting: Hematology & Oncology

## 2021-04-09 ENCOUNTER — Other Ambulatory Visit: Payer: Self-pay

## 2021-04-09 DIAGNOSIS — C50011 Malignant neoplasm of nipple and areola, right female breast: Secondary | ICD-10-CM | POA: Diagnosis not present

## 2021-04-14 ENCOUNTER — Other Ambulatory Visit: Payer: Self-pay | Admitting: Medical-Surgical

## 2021-04-14 ENCOUNTER — Telehealth: Payer: Self-pay

## 2021-04-14 ENCOUNTER — Telehealth: Payer: Self-pay | Admitting: Medical-Surgical

## 2021-04-14 ENCOUNTER — Encounter: Payer: Self-pay | Admitting: *Deleted

## 2021-04-14 DIAGNOSIS — I10 Essential (primary) hypertension: Secondary | ICD-10-CM

## 2021-04-14 DIAGNOSIS — H5711 Ocular pain, right eye: Secondary | ICD-10-CM

## 2021-04-14 NOTE — Telephone Encounter (Signed)
Referral placed.

## 2021-04-14 NOTE — Progress Notes (Signed)
CT scan shows no sign of cancer recurrence. Will discontinue navigation.   Oncology Nurse Navigator Documentation  Oncology Nurse Navigator Flowsheets 04/14/2021  Navigator Follow Up Date: -  Navigator Follow Up Reason: -  Navigation Complete Date: 04/14/2021  Post Navigation: Continue to Follow Patient? No  Reason Not Navigating Patient: No Cancer Diagnosis  Navigator Location [facility]  Referral Date to RadOnc/MedOnc -  Navigator Encounter Type Scan Review  Patient Visit Type MedOnc  Treatment Phase Other  Barriers/Navigation Needs No Barriers At This Time  Education -  Interventions None Required  Acuity Level 1-No Barriers  Coordination of Care -  Education Method -  Support Groups/Services Friends and Family  Time Spent with Patient 15

## 2021-04-14 NOTE — Telephone Encounter (Signed)
-----   Message from Volanda Napoleon, MD sent at 04/12/2021  6:45 AM EST ----- Call - there is no signs of cancer on the CT scan!!   [facility]

## 2021-04-14 NOTE — Telephone Encounter (Signed)
Called and informed patient of results, patient verbalized understanding and denies any questions or concerns at this time.  ? ?

## 2021-04-14 NOTE — Telephone Encounter (Signed)
Courtney Cummings    I called patient about Neurology referral to see if she wanted to return to [facility] or go to [facility] while on the phone she stated she would like a referral to Ophthalmology due to her right eye is still the same and not getting better.  She stated she looked at the [facility] for her symptoms and she thinks it is possible she has a blocked tear duct. Please advise.   Jenny Reichmann

## 2021-04-23 ENCOUNTER — Other Ambulatory Visit: Payer: Self-pay | Admitting: Physician Assistant

## 2021-04-23 DIAGNOSIS — G500 Trigeminal neuralgia: Secondary | ICD-10-CM

## 2021-04-23 DIAGNOSIS — H5711 Ocular pain, right eye: Secondary | ICD-10-CM

## 2021-05-01 ENCOUNTER — Ambulatory Visit: Payer: Medicare Other | Admitting: Cardiology

## 2021-05-26 DIAGNOSIS — J4530 Mild persistent asthma, uncomplicated: Secondary | ICD-10-CM | POA: Diagnosis not present

## 2021-05-26 DIAGNOSIS — J479 Bronchiectasis, uncomplicated: Secondary | ICD-10-CM | POA: Diagnosis not present

## 2021-05-26 DIAGNOSIS — J849 Interstitial pulmonary disease, unspecified: Secondary | ICD-10-CM | POA: Diagnosis not present

## 2021-06-13 DIAGNOSIS — F09 Unspecified mental disorder due to known physiological condition: Secondary | ICD-10-CM | POA: Diagnosis not present

## 2021-06-13 DIAGNOSIS — F411 Generalized anxiety disorder: Secondary | ICD-10-CM | POA: Diagnosis not present

## 2021-06-13 DIAGNOSIS — F330 Major depressive disorder, recurrent, mild: Secondary | ICD-10-CM | POA: Diagnosis not present

## 2021-06-13 DIAGNOSIS — F451 Undifferentiated somatoform disorder: Secondary | ICD-10-CM | POA: Diagnosis not present

## 2021-07-05 ENCOUNTER — Other Ambulatory Visit: Payer: Self-pay | Admitting: Medical-Surgical

## 2021-07-05 DIAGNOSIS — I10 Essential (primary) hypertension: Secondary | ICD-10-CM

## 2021-08-01 ENCOUNTER — Ambulatory Visit (INDEPENDENT_AMBULATORY_CARE_PROVIDER_SITE_OTHER): Payer: Medicare Other | Admitting: Medical-Surgical

## 2021-08-01 ENCOUNTER — Other Ambulatory Visit: Payer: Self-pay

## 2021-08-01 ENCOUNTER — Encounter: Payer: Self-pay | Admitting: Medical-Surgical

## 2021-08-01 VITALS — BP 128/80 | HR 78 | Resp 20 | Ht 67.0 in | Wt 171.6 lb

## 2021-08-01 DIAGNOSIS — E538 Deficiency of other specified B group vitamins: Secondary | ICD-10-CM | POA: Diagnosis not present

## 2021-08-01 DIAGNOSIS — R296 Repeated falls: Secondary | ICD-10-CM | POA: Diagnosis not present

## 2021-08-01 DIAGNOSIS — R531 Weakness: Secondary | ICD-10-CM | POA: Diagnosis not present

## 2021-08-01 DIAGNOSIS — R251 Tremor, unspecified: Secondary | ICD-10-CM

## 2021-08-01 DIAGNOSIS — R7303 Prediabetes: Secondary | ICD-10-CM

## 2021-08-01 LAB — POCT GLYCOSYLATED HEMOGLOBIN (HGB A1C): HbA1c, POC (prediabetic range): 6.1 % (ref 5.7–6.4)

## 2021-08-01 NOTE — Progress Notes (Signed)
?  HPI with pertinent ROS:  ? ?CC: falls/weak/shaky ? ?HPI: ?Pleasant 81 year old female accompanied by her daughter presenting today with complaints of recent episodes of generalized weakness where she feels shaky. She has had a couple of falls in the past couple of weeks had trouble getting up on her own. Notes that she has been having a lot of trouble with anxiety and depression because her son passed away about 2 weeks ago after a prolonged illness. She has not been eating or drinking appropriately and has had difficulty sleeping at times. Worries that she is borderline diabetic and would like to have her A1c checked to make sure this has not progressed. Has not had any injuries with her falls. No syncope, has not hit her head.  ? ?Having difficulty with urinary incontinence. Notes that when she stands up, she cannot control her urine and it ends up leaking out. Saw multiple providers in the past and had several urological procedures done. Is not sure if she wants to go back and have to do that again at her age. No s/s of UTI, fever, chills, or AMS. ? ?I reviewed the past medical history, family history, social history, surgical history, and allergies today and no changes were needed.  Please see the problem list section below in epic for further details. ? ? ?Physical exam:  ? ?General: Well Developed, well nourished, and in no acute distress.  ?Neuro: Alert and oriented x3. Gait unsteady, slow with 4 pronged cane.  ?HEENT: Normocephalic, atraumatic.  ?Skin: Warm and dry, pale. ?Cardiac: Regular rate and rhythm, no murmurs rubs or gallops, no lower extremity edema.  ?Respiratory: Clear to auscultation bilaterally. Not using accessory muscles, speaking in full sentences. ? ?Impression and Recommendations:   ? ?1. Prediabetes ?POCT A1c 6.1% today indicating no progression to DM type 2.  ?- POCT HgB A1C ? ?2. Weakness ?3. Shakiness ?4. Falls frequently ?Checking labs as below. Strongly feel that this is related to  poor self care in the setting of acute grief. Recommend eating small frequent meals and working to stay well hydrated. Discussed grief counseling benefits. Reviewed recommendation to rise slowly and make sure to stabilize with a walking aid before beginning to take steps. She does have a walker/rollator but reports it won't fit through the doors so she doesn't like to use it. Recommend looking into options that she can use at home and travel with as her cane is no longer providing enough stability to prevent falls. ?- CBC with Differential ?- COMPLETE METABOLIC PANEL WITH GFR ?- TSH ?- Vitamin B12 ? ?Return in about 3 months (around 11/01/2021) for chronic disease follow up. ?___________________________________________ ?Clearnce Sorrel, DNP, APRN, FNP-BC ?Primary Care and Sports Medicine ?[place] ?

## 2021-08-02 LAB — COMPLETE METABOLIC PANEL WITH GFR
AG Ratio: 1.6 (calc) (ref 1.0–2.5)
ALT: 21 U/L (ref 6–29)
AST: 23 U/L (ref 10–35)
Albumin: 4.5 g/dL (ref 3.6–5.1)
Alkaline phosphatase (APISO): 98 U/L (ref 37–153)
BUN: 16 mg/dL (ref 7–25)
CO2: 25 mmol/L (ref 20–32)
Calcium: 9.5 mg/dL (ref 8.6–10.4)
Chloride: 103 mmol/L (ref 98–110)
Creat: 1 mg/dL (ref 0.60–1.00)
Globulin: 2.9 g/dL (calc) (ref 1.9–3.7)
Glucose, Bld: 97 mg/dL (ref 65–99)
Potassium: 4.6 mmol/L (ref 3.5–5.3)
Sodium: 139 mmol/L (ref 135–146)
Total Bilirubin: 0.6 mg/dL (ref 0.2–1.2)
Total Protein: 7.4 g/dL (ref 6.1–8.1)
eGFR: 57 mL/min/{1.73_m2} — ABNORMAL LOW (ref >=60)

## 2021-08-02 LAB — TSH: TSH: 1.78 mIU/L (ref 0.40–4.50)

## 2021-08-02 LAB — CBC WITH DIFFERENTIAL/PLATELET
Absolute Monocytes: 417 cells/uL (ref 200–950)
Basophils Absolute: 51 cells/uL (ref 0–200)
Basophils Relative: 0.6 %
Eosinophils Absolute: 170 cells/uL (ref 15–500)
Eosinophils Relative: 2 %
HCT: 44.1 % (ref 35.0–45.0)
Hemoglobin: 14.9 g/dL (ref 11.7–15.5)
Lymphs Abs: 3451 cells/uL (ref 850–3900)
MCH: 30.2 pg (ref 27.0–33.0)
MCHC: 33.8 g/dL (ref 32.0–36.0)
MCV: 89.3 fL (ref 80.0–100.0)
MPV: 9.8 fL (ref 7.5–12.5)
Monocytes Relative: 4.9 %
Neutro Abs: 4412 cells/uL (ref 1500–7800)
Neutrophils Relative %: 51.9 %
Platelets: 278 10*3/uL (ref 140–400)
RBC: 4.94 10*6/uL (ref 3.80–5.10)
RDW: 12.7 % (ref 11.0–15.0)
Total Lymphocyte: 40.6 %
WBC: 8.5 10*3/uL (ref 3.8–10.8)

## 2021-08-02 LAB — VITAMIN B12: Vitamin B-12: 453 pg/mL (ref 200–1100)

## 2021-08-21 ENCOUNTER — Other Ambulatory Visit: Payer: Self-pay

## 2021-08-21 NOTE — Telephone Encounter (Signed)
Not recommend for patients to be on benzodiazepines and narcotics at the same time. Already at very high risk for falls/injuries with chronic Lorazepam. Not seen for any issue requiring Tramadol and not prescribed in 1 year.  ?

## 2021-08-21 NOTE — Telephone Encounter (Signed)
Patient advised of message and verbalized understanding.  

## 2021-08-26 ENCOUNTER — Other Ambulatory Visit: Payer: Self-pay

## 2021-08-26 DIAGNOSIS — J849 Interstitial pulmonary disease, unspecified: Secondary | ICD-10-CM | POA: Insufficient documentation

## 2021-08-26 DIAGNOSIS — K635 Polyp of colon: Secondary | ICD-10-CM | POA: Insufficient documentation

## 2021-08-26 DIAGNOSIS — N289 Disorder of kidney and ureter, unspecified: Secondary | ICD-10-CM | POA: Insufficient documentation

## 2021-08-26 DIAGNOSIS — I219 Acute myocardial infarction, unspecified: Secondary | ICD-10-CM | POA: Insufficient documentation

## 2021-08-26 DIAGNOSIS — I82409 Acute embolism and thrombosis of unspecified deep veins of unspecified lower extremity: Secondary | ICD-10-CM | POA: Insufficient documentation

## 2021-08-26 DIAGNOSIS — J449 Chronic obstructive pulmonary disease, unspecified: Secondary | ICD-10-CM | POA: Insufficient documentation

## 2021-08-26 DIAGNOSIS — A4902 Methicillin resistant Staphylococcus aureus infection, unspecified site: Secondary | ICD-10-CM | POA: Insufficient documentation

## 2021-08-28 ENCOUNTER — Ambulatory Visit (INDEPENDENT_AMBULATORY_CARE_PROVIDER_SITE_OTHER): Payer: Medicare Other

## 2021-08-28 ENCOUNTER — Encounter: Payer: Self-pay | Admitting: Cardiology

## 2021-08-28 ENCOUNTER — Ambulatory Visit: Payer: Medicare Other | Admitting: Cardiology

## 2021-08-28 VITALS — BP 106/62 | HR 68 | Ht 68.0 in | Wt 175.1 lb

## 2021-08-28 DIAGNOSIS — R9431 Abnormal electrocardiogram [ECG] [EKG]: Secondary | ICD-10-CM

## 2021-08-28 DIAGNOSIS — R011 Cardiac murmur, unspecified: Secondary | ICD-10-CM

## 2021-08-28 DIAGNOSIS — E782 Mixed hyperlipidemia: Secondary | ICD-10-CM | POA: Diagnosis not present

## 2021-08-28 DIAGNOSIS — R002 Palpitations: Secondary | ICD-10-CM

## 2021-08-28 DIAGNOSIS — I10 Essential (primary) hypertension: Secondary | ICD-10-CM | POA: Diagnosis not present

## 2021-08-28 DIAGNOSIS — Z789 Other specified health status: Secondary | ICD-10-CM

## 2021-08-28 NOTE — Progress Notes (Signed)
?Cardiology Office Note:   ? ?Date:  08/28/2021  ? ?IDKennadi Cummings, DOB [birth year], MRN [id number] ? ?PCP:  Samuel Bouche, NP  ?Cardiologist:  Jenean Lindau, MD  ? ?Referring MD: Samuel Bouche, NP  ? ? ?ASSESSMENT:   ? ?1. Essential hypertension   ?2. Mixed hyperlipidemia   ?3. Abnormal EKG   ?4. Palpitations   ?5. Cardiac murmur   ?6. Statin intolerance   ? ?PLAN:   ? ?In order of problems listed above: ? ?Primary prevention stressed with the patient.  Importance of compliance with diet medication stressed and she vocalized understanding. ?Essential hypertension: Blood pressure stable and diet was emphasized. ?Cardiac murmur: Echocardiogram will be done to assess murmur heard on auscultation. ?Palpitations: I noted blood work and TSH was fine.  She will have 2-week monitor to assess the symptoms.  Clinically they appear benign but I want to evaluate them thoroughly in view of her concerns. ?Mixed dyslipidemia: Markedly elevated lipids and she will be in touch with her primary care for this.  Emphasized.  She is statin intolerant according to the history that she provides and not keen on lipid-lowering medications.  I explained this.  I respect her wishes. ?Patient will be seen in follow-up appointment in 6 months or earlier if the patient has any concerns ? ? ? ?Medication Adjustments/Labs and Tests Ordered: ?Current medicines are reviewed at length with the patient today.  Concerns regarding medicines are outlined above.  ?Orders Placed This Encounter  ?Procedures  ? LONG TERM MONITOR (3-14 DAYS)  ? EKG 12-Lead  ? ECHOCARDIOGRAM COMPLETE  ? ?No orders of the defined types were placed in this encounter. ? ? ? ?History of Present Illness:   ? ?Courtney Cummings is a 81 y.o. female who is being seen today for the evaluation of palpitations at the request of Samuel Bouche, NP.  Patient is a pleasant 81 year old female.  She has past medical history of essential hypertension.  She is undergoing chemotherapy for cancer.  She  mentions to me that she has palpitations especially at night.  No dizziness no syncope.  No orthopnea or PND.  She is concerned about his and therefore was referred here.  She denies any chest pain.  She takes care of activities of daily living. ? ?Past Medical History:  ?Diagnosis Date  ? Acute right eye pain 03/25/2021  ? Allergy status to unspecified drugs, medicaments and biological substances 02/23/2013  ? Asthma, mild persistent 03/02/2013  ? B12 deficiency 08/24/2017  ? Chronic anxiety 04/08/2020  ? Formatting of this note might be different from the original. sees dr. love  ? Chronic depression 04/08/2020  ? Formatting of this note might be different from the original. sees dr. love  ? Cognitive disorder 10/22/2012  ? Colon polyps   ? COPD (chronic obstructive pulmonary disease) ([place])   ? Dizziness 10/22/2012  ? DVT (deep venous thrombosis) ([place])   ? 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 Formatting of this note might be different from the original. Added automatically from request for surgery [phone]  ? Essential hypertension 10/22/2012  ? Gastroesophageal reflux disease without esophagitis 10/22/2012  ? Generalized anxiety disorder 11/15/2012  ? Heart attack ([place])   ? History of colon polyps 01/13/2017  ? History of hysterectomy 02/09/2017  ? History of recurrent UTIs 11/19/2016  ? History of shingles 04/08/2020  ? Hyperlipidemia   ? Idiopathic peripheral neuropathy 01/30/2020  ? 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 Formatting of this note might be  different from the original. sees dr. Hazle Nordmann  ? Interstitial lung disease ([place])   ? Irregular heart rhythm 04/08/2020  ? Formatting of this note might be different from the original. seen by dr. Linus Salmons and [place] cards had ablation in past no name  ? Kidney disease   ? LBBB (left bundle branch block) 09/22/2017  ? 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 ([place]) 08/09/2012  ? Mixed  incontinence urge and stress 10/08/2015  ? MRSA (methicillin resistant Staphylococcus aureus)   ? 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 Nuclear sclerotic cataract of left eye 06/25/2016  ? Osteoarthrosis 10/22/2012  ? Other nonspecific abnormal finding of lung field 03/02/2013  ? Personal history of malignant neoplasm of breast 09/23/2011  ? Prediabetes 04/08/2020  ? Recurrent falls 01/07/2017  ? 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 Right-sided headache 04/02/2021  ? Statin intolerance 07/21/2017  ? Transient ischemic attack (TIA) 03/04/2017  ? Trigeminal neuralgia of right side of face 03/25/2021  ? ? ?Past Surgical History:  ?Procedure Laterality Date  ? ABDOMINAL HYSTERECTOMY    ? APPENDECTOMY    ? BREAST LUMPECTOMY    ? CHOLECYSTECTOMY    ? ? ?Current Medications: ?Current Meds  ?Medication Sig  ? acetaminophen (TYLENOL) 325 MG tablet Take 650 mg by mouth every 6 (six) hours as needed for pain or headache.  ? albuterol (VENTOLIN HFA) 108 (90 Base) MCG/ACT inhaler Inhale 2 puffs into the lungs every 4 (four) hours as needed for wheezing or shortness of breath.  ? amLODipine (NORVASC) 2.5 MG tablet Take 1 tablet (2.5 mg total) by mouth daily. DUE FOR FOLLOW UP APPT  ? docusate sodium (COLACE) 100 MG capsule Take 100 mg by mouth daily as needed for mild constipation.  ? LORazepam (ATIVAN) 1 MG tablet Take 1 tablet (1 mg total) by mouth 2 (two) times daily as needed (Use very sparingly).  ? meclizine (ANTIVERT) 25 MG tablet Take 1 tablet (25 mg total) by mouth 3 (three) times daily as needed for dizziness or nausea.  ? Multiple Vitamin (MULTI-VITAMIN) tablet Take 1 tablet by mouth daily.  ? omeprazole (PRILOSEC) 40 MG capsule TAKE 1 CAPSULE(40 MG) BY MOUTH DAILY  ? sertraline (ZOLOFT) 50 MG tablet Take '50mg'$  (1 tablet) every morning and '25mg'$  (1/2 tablet) every evening for 1 week then increase to '50mg'$  twice daily.  ? traMADol (ULTRAM) 50 MG tablet Take 50 mg by mouth every 6 (six) hours as needed for  pain.  ?  ? ?Allergies:   Diltiazem, Iodinated contrast media, Iodine, Iodine-131, Levalbuterol hcl, Meperidine hcl, Mometasone, Quinolones, Tape, Tiotropium, Umeclidinium, Asmanex (120 metered doses) [mometasone furoate], Azithromycin, Elemental sulfur, Statins, Sulfa antibiotics, Sulfasalazine, Valacyclovir, Codeine, Diltiazem hcl, Fluticasone furoate-vilanterol, Latex, Lisinopril, and Pantoprazole  ? ?Social History  ? ?Socioeconomic History  ? Marital status: Widowed  ?  Spouse name: Not on file  ? Number of children: Not on file  ? Years of education: Not on file  ? Highest education level: Not on file  ?Occupational History  ? Not on file  ?Tobacco Use  ? Smoking status: Former  ?  Packs/day: 0.50  ?  Types: Cigarettes  ?  Quit date: 04/08/1990  ?  Years since quitting: 31.4  ? Smokeless tobacco: Never  ?Vaping Use  ? Vaping Use: Never used  ?Substance and Sexual Activity  ? Alcohol use: Yes  ?  Comment: occasionally  ? Drug use: Never  ?  Sexual activity: Not Currently  ?  Birth control/protection: Surgical  ?  Comment: hysterectomy  ?Other Topics Concern  ? Not on file  ?Social History Narrative  ? Not on file  ? ?Social Determinants of Health  ? ?Financial Resource Strain: Not on file  ?Food Insecurity: Not on file  ?Transportation Needs: Not on file  ?Physical Activity: Not on file  ?Stress: Not on file  ?Social Connections: Not on file  ?  ? ?Family History: ?The patient's family history includes Diabetes in her brother, brother, brother, father, mother, paternal grandfather, paternal grandmother, and son; Heart attack in her mother; Hypertension in her brother, brother, brother, father, mother, paternal aunt, paternal grandfather, paternal grandmother, and paternal uncle; Ovarian cancer in her mother; Pulmonary fibrosis in her brother; Stroke in her mother; Tuberculosis in her father. ? ?ROS:   ?Please see the history of present illness.    ?All other systems reviewed and are negative. ? ?EKGs/Labs/Other  Studies Reviewed:   ? ?The following studies were reviewed today: ?EKG reveals sinus rhythm left axis deviation left ventricular hypertrophy nonspecific ST-T changes ? ? ?Recent Labs: ?08/01/2021: ALT 21; BU

## 2021-08-28 NOTE — Patient Instructions (Signed)
Medication Instructions:  ?Your physician recommends that you continue on your current medications as directed. Please refer to the Current Medication list given to you today. ? ?*If you need a refill on your cardiac medications before your next appointment, please call your pharmacy* ? ? ?Lab Work: ?None ordered ?If you have labs (blood work) drawn today and your tests are completely normal, you will receive your results only by: ?MyChart Message (if you have MyChart) OR ?A paper copy in the mail ?If you have any lab test that is abnormal or we need to change your treatment, we will call you to review the results. ? ? ?Testing/Procedures: ?Your physician has requested that you have an echocardiogram. Echocardiography is a painless test that uses sound waves to create images of your heart. It provides your doctor with information about the size and shape of your heart and how well your heart?s chambers and valves are working. This procedure takes approximately one hour. There are no restrictions for this procedure. ? ? ?WHY IS MY DOCTOR PRESCRIBING ZIO? ?The Zio system is proven and trusted by physicians to detect and diagnose irregular heart rhythms -- and has been prescribed to hundreds of thousands of patients. ? ?The FDA has cleared the Zio system to monitor for many different kinds of irregular heart rhythms. In a study, physicians were able to reach a diagnosis 90% of the time with the Zio system1. ? ?You can wear the Zio monitor -- a small, discreet, comfortable patch -- during your normal day-to-day activity, including while you sleep, shower, and exercise, while it records every single heartbeat for analysis. ? ?1Barrett, P., et al. Comparison of 24 Hour Holter Monitoring Versus 14 Day Novel Adhesive Patch Electrocardiographic Monitoring. [place], 2014. ? ?ZIO VS. HOLTER MONITORING ?The Zio monitor can be comfortably worn for up to 14 days. Holter monitors can be worn for 24 to 48  hours, limiting the time to record any irregular heart rhythms you may have. Zio is able to capture data for the 51% of patients who have their first symptom-triggered arrhythmia after 48 hours.1 ? ?LIVE WITHOUT RESTRICTIONS ?The Zio ambulatory cardiac monitor is a small, unobtrusive, and water-resistant patch--you might even forget you?re wearing it. The Zio monitor records and stores every beat of your heart, whether you're sleeping, working out, or showering. ?Wear the monitor for 14 days, remove 09/11/21. ? ? ? ?Follow-Up: ?At [facility], you and your health needs are our priority.  As part of our continuing mission to provide you with exceptional heart care, we have created designated Provider Care Teams.  These Care Teams include your primary Cardiologist (physician) and Advanced Practice Providers (APPs -  Physician Assistants and Nurse Practitioners) who all work together to provide you with the care you need, when you need it. ? ?We recommend signing up for the patient portal called "MyChart".  Sign up information is provided on this After Visit Summary.  MyChart is used to connect with patients for Virtual Visits (Telemedicine).  Patients are able to view lab/test results, encounter notes, upcoming appointments, etc.  Non-urgent messages can be sent to your provider as well.   ?To learn more about what you can do with MyChart, go to [email or web address].   ? ?Your next appointment:   ?9 month(s) ? ?The format for your next appointment:   ?In Person ? ?Provider:   ?Jyl Heinz, MD ? ? ?Other Instructions ?Echocardiogram ?An echocardiogram is a test that uses sound waves (ultrasound) to produce  images of the heart. ?Images from an echocardiogram can provide important information about: ?Heart size and shape. ?The size and thickness and movement of your heart's walls. ?Heart muscle function and strength. ?Heart valve function or if you have stenosis. Stenosis is when the heart valves are too  narrow. ?If blood is flowing backward through the heart valves (regurgitation). ?A tumor or infectious growth around the heart valves. ?Areas of heart muscle that are not working well because of poor blood flow or injury from a heart attack. ?Aneurysm detection. An aneurysm is a weak or damaged part of an artery wall. The wall bulges out from the normal force of blood pumping through the body. ?Tell a health care provider about: ?Any allergies you have. ?All medicines you are taking, including vitamins, herbs, eye drops, creams, and over-the-counter medicines. ?Any blood disorders you have. ?Any surgeries you have had. ?Any medical conditions you have. ?Whether you are pregnant or may be pregnant. ?What are the risks? ?Generally, this is a safe test. However, problems may occur, including an allergic reaction to dye (contrast) that may be used during the test. ?What happens before the test? ?No specific preparation is needed. You may eat and drink normally. ?What happens during the test? ?You will take off your clothes from the waist up and put on a hospital gown. ?Electrodes or electrocardiogram (ECG)patches may be placed on your chest. The electrodes or patches are then connected to a device that monitors your heart rate and rhythm. ?You will lie down on a table for an ultrasound exam. A gel will be applied to your chest to help sound waves pass through your skin. ?A handheld device, called a transducer, will be pressed against your chest and moved over your heart. The transducer produces sound waves that travel to your heart and bounce back (or "echo" back) to the transducer. These sound waves will be captured in real-time and changed into images of your heart that can be viewed on a video monitor. The images will be recorded on a computer and reviewed by your health care provider. ?You may be asked to change positions or hold your breath for a short time. This makes it easier to get different views or better  views of your heart. ?In some cases, you may receive contrast through an IV in one of your veins. This can improve the quality of the pictures from your heart. ?The procedure may vary among health care providers and hospitals.   ?What can I expect after the test? ?You may return to your normal, everyday life, including diet, activities, and medicines, unless your health care provider tells you not to do that. ?Follow these instructions at home: ?It is up to you to get the results of your test. Ask your health care provider, or the department that is doing the test, when your results will be ready. ?Keep all follow-up visits. This is important. ?Summary ?An echocardiogram is a test that uses sound waves (ultrasound) to produce images of the heart. ?Images from an echocardiogram can provide important information about the size and shape of your heart, heart muscle function, heart valve function, and other possible heart problems. ?You do not need to do anything to prepare before this test. You may eat and drink normally. ?After the echocardiogram is completed, you may return to your normal, everyday life, unless your health care provider tells you not to do that. ?This information is not intended to replace advice given to you by your health care  provider. Make sure you discuss any questions you have with your health care provider. ?Document Revised: 12/26/2019 Document Reviewed: 12/26/2019 ?Elsevier Patient Education ? [place]. ? ? ?

## 2021-09-10 ENCOUNTER — Ambulatory Visit (HOSPITAL_BASED_OUTPATIENT_CLINIC_OR_DEPARTMENT_OTHER)
Admission: RE | Admit: 2021-09-10 | Discharge: 2021-09-10 | Disposition: A | Discharge status: Home / Self Care | Payer: Medicare Other | Source: Ambulatory Visit | Attending: Cardiology | Admitting: Cardiology

## 2021-09-10 DIAGNOSIS — R011 Cardiac murmur, unspecified: Secondary | ICD-10-CM | POA: Diagnosis not present

## 2021-09-10 LAB — ECHOCARDIOGRAM COMPLETE
AR max vel: 2.41 cm2
AV Area VTI: 2.36 cm2
AV Area mean vel: 2.22 cm2
AV Mean grad: 5 mmHg
AV Peak grad: 7.6 mmHg
Ao pk vel: 1.38 m/s
Area-P 1/2: 2.76 cm2
S' Lateral: 2.9 cm

## 2021-09-10 NOTE — Progress Notes (Signed)
?  Echocardiogram ?2D Echocardiogram has been performed. ? ?Elmer Ramp ?09/10/2021, 11:15 AM ?

## 2021-09-12 ENCOUNTER — Ambulatory Visit (INDEPENDENT_AMBULATORY_CARE_PROVIDER_SITE_OTHER): Payer: Medicare Other | Admitting: Medical-Surgical

## 2021-09-12 ENCOUNTER — Encounter: Payer: Self-pay | Admitting: Medical-Surgical

## 2021-09-12 VITALS — BP 115/75 | HR 91 | Temp 97.6°F | Ht 68.0 in | Wt 179.0 lb

## 2021-09-12 DIAGNOSIS — M79602 Pain in left arm: Secondary | ICD-10-CM

## 2021-09-12 MED ORDER — CELECOXIB 100 MG PO CAPS: 100.0000 mg | ORAL_CAPSULE | Freq: Two times a day (BID) | ORAL | 2 refills | Status: DC

## 2021-09-12 MED ORDER — TRAMADOL HCL 50 MG PO TABS: 50.0000 mg | ORAL_TABLET | Freq: Four times a day (QID) | ORAL | 0 refills | Status: DC | PRN

## 2021-09-12 NOTE — Progress Notes (Signed)
?  HPI with pertinent ROS:  ? ?CC: Left arm pain related to lymphedema ? ?HPI: ?Pleasant 81 year old female accompanied by her daughter presenting today for evaluation of left arm pain.  Notes this has been going on consistently for the last month or more.  Feels that the axillary region, left upper chest, and left arm are swollen.  The areas are extremely uncomfortable and painful to touch.  She feels that this is related to lymphedema from her previous cancer treatments and surgeries.  Has been taking ibuprofen and Tylenol which does seem to help some however the ibuprofen seems to upset her stomach greatly.  Had a leftover tramadol from a prior prescription which she took and was helpful.  She is taking gabapentin twice daily as prescribed and feels that it does help some.  Denies fever, shortness of breath, chest pain, altered mental status, and urinary symptoms.  She does have chills at night but this has been an ongoing issue although she does note that the her chills have worsened at night over the last month. ? ?I reviewed the past medical history, family history, social history, surgical history, and allergies today and no changes were needed.  Please see the problem list section below in epic for further details. ? ?Physical exam:  ? ?General: Well Developed, well nourished, and in no acute distress.  ?Neuro: Alert and oriented x3.  ?HEENT: Normocephalic, atraumatic.  ?Skin: Warm and dry. ?Cardiac: Regular rate and rhythm, no murmurs rubs or gallops, no lower extremity edema.  ?Respiratory: Clear to auscultation bilaterally. Not using accessory muscles, speaking in full sentences. ? ?Impression and Recommendations:   ? ?1. Left arm pain ?Unclear etiology.  She does have some swelling and tenderness on exam.  Unsure if this is an infectious process versus an inflammatory one.  Since she has seen some improvement with the use of ibuprofen, suspect this is more inflammatory.  Continue gabapentin for now.   Adding Celebrex 100 mg twice daily.  Continue Tylenol and topical treatments.  I will refill tramadol for severe pain only and avoid taking it in conjunction with Ativan. ? ?Return if symptoms worsen or fail to improve. ?___________________________________________ ?Clearnce Sorrel, DNP, APRN, FNP-BC ?Primary Care and Sports Medicine ?[place] ?

## 2021-09-16 ENCOUNTER — Other Ambulatory Visit: Payer: Self-pay

## 2021-09-16 ENCOUNTER — Emergency Department (HOSPITAL_BASED_OUTPATIENT_CLINIC_OR_DEPARTMENT_OTHER)
Admission: EM | Admit: 2021-09-16 | Discharge: 2021-09-16 | Disposition: A | Discharge status: Home / Self Care | Payer: Medicare Other | Attending: Emergency Medicine | Admitting: Emergency Medicine

## 2021-09-16 ENCOUNTER — Emergency Department (HOSPITAL_BASED_OUTPATIENT_CLINIC_OR_DEPARTMENT_OTHER): Payer: Medicare Other

## 2021-09-16 DIAGNOSIS — Z91040 Latex allergy status: Secondary | ICD-10-CM | POA: Insufficient documentation

## 2021-09-16 DIAGNOSIS — J449 Chronic obstructive pulmonary disease, unspecified: Secondary | ICD-10-CM | POA: Insufficient documentation

## 2021-09-16 DIAGNOSIS — Z79899 Other long term (current) drug therapy: Secondary | ICD-10-CM | POA: Diagnosis not present

## 2021-09-16 DIAGNOSIS — I890 Lymphedema, not elsewhere classified: Secondary | ICD-10-CM | POA: Insufficient documentation

## 2021-09-16 DIAGNOSIS — I10 Essential (primary) hypertension: Secondary | ICD-10-CM | POA: Insufficient documentation

## 2021-09-16 DIAGNOSIS — M79602 Pain in left arm: Secondary | ICD-10-CM | POA: Diagnosis not present

## 2021-09-16 DIAGNOSIS — M7989 Other specified soft tissue disorders: Secondary | ICD-10-CM | POA: Diagnosis present

## 2021-09-16 LAB — CBC
HCT: 41 % (ref 36.0–46.0)
Hemoglobin: 13.7 g/dL (ref 12.0–15.0)
MCH: 30 pg (ref 26.0–34.0)
MCHC: 33.4 g/dL (ref 30.0–36.0)
MCV: 89.7 fL (ref 80.0–100.0)
Platelets: 219 10*3/uL (ref 150–400)
RBC: 4.57 MIL/uL (ref 3.87–5.11)
RDW: 13.7 % (ref 11.5–15.5)
WBC: 5.5 10*3/uL (ref 4.0–10.5)
nRBC: 0 % (ref 0.0–0.2)

## 2021-09-16 LAB — BASIC METABOLIC PANEL
Anion gap: 7 (ref 5–15)
BUN: 19 mg/dL (ref 8–23)
CO2: 24 mmol/L (ref 22–32)
Calcium: 8.8 mg/dL — ABNORMAL LOW (ref 8.9–10.3)
Chloride: 108 mmol/L (ref 98–111)
Creatinine, Ser: 0.93 mg/dL (ref 0.44–1.00)
GFR, Estimated: >60 mL/min (ref >60)
Glucose, Bld: 131 mg/dL — ABNORMAL HIGH (ref 70–99)
Potassium: 4.2 mmol/L (ref 3.5–5.1)
Sodium: 139 mmol/L (ref 135–145)

## 2021-09-16 NOTE — ED Provider Notes (Signed)
?[facility] ?Provider Note ? ? ?CSN: [id number] ?Arrival date & time: 09/16/21  1017 ? ?  ? ?History ? ?Chief Complaint  ?Patient presents with  ? swelling to left arms, chest , hands, neck  ? ? ?[facility] is a 80 y.o. female.  Patient presents to the hospital complaining of a swollen upper left extremity that began on approximately Friday.  Patient was evaluated Friday by primary care who prescribed Celebrex for inflammation.  Patient states that over the weekend the Celebrex made her nauseous and she did not take it.  She complains of continued swelling and pain this morning.  Patient states the pain does travel up into the left side of her neck as well.  Patient has remote history of treatment for cancer.  Patient has history of intermittent lymphedema since that time.  Patient states the symptoms are consistent with her previous lymphedema but seem slightly more severe.  Past medical history significant for history of DVT, COPD, interstitial lung disease, previous heart attack, previous TIA, hypertension, trigeminal neuralgia of right side, history of shingles, generalized anxiety disorder, left bundle branch block, lymphedema ? ?HPI ? ?  ? ?Home Medications ?Prior to Admission medications   ?Medication Sig Start Date End Date Taking? Authorizing Provider  ?acetaminophen (TYLENOL) 325 MG tablet Take 650 mg by mouth every 6 (six) hours as needed for pain or headache.    [provider]  ?albuterol (VENTOLIN HFA) 108 (90 Base) MCG/ACT inhaler Inhale 2 puffs into the lungs every 4 (four) hours as needed for wheezing or shortness of breath. 04/08/20   Samuel Bouche, NP  ?amLODipine (NORVASC) 2.5 MG tablet Take 1 tablet (2.5 mg total) by mouth daily. DUE FOR FOLLOW UP APPT 07/06/21   Samuel Bouche, NP  ?celecoxib (CELEBREX) 100 MG capsule Take 1 capsule (100 mg total) by mouth 2 (two) times daily. 09/12/21   Samuel Bouche, NP  ?docusate sodium (COLACE) 100 MG capsule Take 100 mg by  mouth daily as needed for mild constipation.    [provider]  ?LORazepam (ATIVAN) 1 MG tablet Take 1 tablet (1 mg total) by mouth 2 (two) times daily as needed (Use very sparingly). 08/23/20   Samuel Bouche, NP  ?meclizine (ANTIVERT) 25 MG tablet Take 1 tablet (25 mg total) by mouth 3 (three) times daily as needed for dizziness or nausea. 09/24/20   Samuel Bouche, NP  ?Multiple Vitamin (MULTI-VITAMIN) tablet Take 1 tablet by mouth daily.    [provider]  ?omeprazole (PRILOSEC) 40 MG capsule TAKE 1 CAPSULE(40 MG) BY MOUTH DAILY 03/17/21   Samuel Bouche, NP  ?sertraline (ZOLOFT) 50 MG tablet Take '50mg'$  (1 tablet) every morning and '25mg'$  (1/2 tablet) every evening for 1 week then increase to '50mg'$  twice daily. 12/25/20   Samuel Bouche, NP  ?traMADol (ULTRAM) 50 MG tablet Take 1 tablet (50 mg total) by mouth every 6 (six) hours as needed. 09/12/21   Samuel Bouche, NP  ?   ? ?Allergies    ?Diltiazem, Iodinated contrast media, Iodine, Iodine-131, Levalbuterol hcl, Meperidine hcl, Mometasone, Quinolones, Tape, Tiotropium, Umeclidinium, Asmanex (120 metered doses) [mometasone furoate], Azithromycin, Elemental sulfur, Statins, Sulfa antibiotics, Sulfasalazine, Valacyclovir, Codeine, Diltiazem hcl, Fluticasone furoate-vilanterol, Latex, Lisinopril, and Pantoprazole   ? ?Review of Systems   ?Review of Systems  ?Constitutional:  Negative for fever.  ?Respiratory:  Negative for shortness of breath.   ?Cardiovascular:  Negative for chest pain.  ?Musculoskeletal:  Positive for joint swelling.  ?Skin:  Negative for  rash.  ? ?Physical Exam ?Updated Vital Signs ?BP (!) 160/80   Pulse (!) 58   Temp 98 ?F (36.7 ?C) (Oral)   Resp 18   Wt 77.1 kg   SpO2 99%   BMI 25.85 kg/m?  ?Physical Exam ?Vitals and nursing note reviewed.  ?Constitutional:   ?   General: She is not in acute distress. ?   Appearance: She is normal weight.  ?HENT:  ?   Head: Normocephalic.  ?Eyes:  ?   Conjunctiva/sclera: Conjunctivae normal.  ?Neck:  ?    Comments: Tenderness to palpation left side of neck ?Cardiovascular:  ?   Rate and Rhythm: Normal rate.  ?   Pulses: Normal pulses.  ?Pulmonary:  ?   Effort: Pulmonary effort is normal.  ?   Breath sounds: Normal breath sounds.  ?Abdominal:  ?   Palpations: Abdomen is soft.  ?Musculoskeletal:     ?   General: Tenderness present.  ?   Cervical back: Normal range of motion and neck supple. Tenderness present.  ?   Comments: Tenderness noted left upper arm, left anterior chest, left side of neck.  No swelling or erythema noted  ?Skin: ?   General: Skin is warm and dry.  ?   Findings: No erythema.  ?Neurological:  ?   Mental Status: She is alert and oriented to person, place, and time.  ? ? ?ED Results / Procedures / Treatments   ?Labs ?(all labs ordered are listed, but only abnormal results are displayed) ?Labs Reviewed  ?BASIC METABOLIC PANEL - Abnormal; Notable for the following components:  ?    Result Value  ? Glucose, Bld 131 (*)   ? Calcium 8.8 (*)   ? All other components within normal limits  ?CBC  ? ? ?EKG ?EKG Interpretation ? ?Date/Time:  Tuesday Sep 16 2021 10:32:30 EDT ?Ventricular Rate:  61 ?PR Interval:  198 ?QRS Duration: 158 ?QT Interval:  476 ?QTC Calculation: 480 ?R Axis:   -46 ?Text Interpretation: Sinus rhythm Left bundle branch block Baseline wander in lead(s) V3 No old tracing to compare Confirmed by Aletta Edouard [phone]) on 09/16/2021 10:34:06 AM ? ?Radiology ?US Venous Img Upper Left (DVT Study) ? ?Result Date: 09/16/2021 ?CLINICAL DATA:  LEFT arm pain question deep venous thrombosis EXAM: LEFT UPPER EXTREMITY VENOUS DOPPLER ULTRASOUND TECHNIQUE: Gray-scale sonography with graded compression, as well as color Doppler and duplex ultrasound were performed to evaluate the upper extremity deep venous system from the level of the subclavian vein and including the jugular, axillary, basilic, radial, ulnar and upper cephalic vein. Spectral Doppler was utilized to evaluate flow at rest and with distal  augmentation maneuvers. COMPARISON:  None FINDINGS: Contralateral Subclavian Vein: Respiratory phasicity is normal and symmetric with the symptomatic side. No evidence of thrombus. Normal compressibility. Internal Jugular Vein: No evidence of thrombus. Normal compressibility, respiratory phasicity and response to augmentation. Subclavian Vein: No evidence of thrombus. Normal compressibility, respiratory phasicity and response to augmentation. Axillary Vein: No evidence of thrombus. Normal compressibility, respiratory phasicity and response to augmentation. Cephalic Vein: No evidence of thrombus. Normal compressibility, respiratory phasicity and response to augmentation. Basilic Vein: No evidence of thrombus. Normal compressibility, respiratory phasicity and response to augmentation. Brachial Veins: No evidence of thrombus. Normal compressibility, respiratory phasicity and response to augmentation. Radial Veins: No evidence of thrombus. Normal compressibility, respiratory phasicity and response to augmentation. Ulnar Veins: No evidence of thrombus. Normal compressibility, respiratory phasicity and response to augmentation. Venous Reflux:  None visualized. Other Findings:  None visualized. IMPRESSION: No evidence of DVT within the LEFT upper extremity. Electronically Signed   By: Lavonia Dana M.D.   On: 09/16/2021 11:48   ? ?Procedures ?Procedures  ? ? ?Medications Ordered in ED ?Medications - No data to display ? ?ED Course/ Medical Decision Making/ A&P ?Clinical Course as of 09/16/21 1222  ?Tue Sep 16, 2021  ?1217 She is here with a complaint of worsening swelling to her left arm and hand.  History of lymphedema.  No recent trauma.  Has intact distal pulses motor and sensation.  Duplex without any evidence of DVT.  Recommended close follow-up with PCP. [MB]  ?  ?Clinical Course User Index ?[MB] Hayden Rasmussen, MD  ? ?                        ?Medical Decision Making ?Amount and/or Complexity of Data Reviewed ?Labs:  ordered. ? ? ?This patient presents to the ED for concern of upper extremity swelling, this involves an extensive number of treatment options, and is a complaint that carries with it a high risk of complications

## 2021-09-16 NOTE — Discharge Instructions (Addendum)
You were seen today for pain in the left arm and neck.  As discussed, no signs of DVT on ultrasound study.  There are no signs of infection at this time.  This is likely a recurrence of your lymphedema.  Recommend follow-up with your primary care doctor ?

## 2021-09-16 NOTE — ED Triage Notes (Signed)
Pt states she has a hx of breast cancer and has in the past had swelling to her chest, neck, arm.  However states its worse this time. States she also has severe pain in her neck and head. Denies cp or sob. ?

## 2021-09-19 ENCOUNTER — Telehealth: Payer: Self-pay

## 2021-09-19 NOTE — Telephone Encounter (Signed)
Patient notified of the following per Dr. Geraldo Pitter. ?

## 2021-09-19 NOTE — Telephone Encounter (Signed)
-----   Message from Jenean Lindau, MD sent at 09/18/2021  2:58 PM EDT ----- ?Certainly not.  This is something minor and she does not need medicine for this.  Only if things get worse with symptoms she can contact us for medications. ?----- Message ----- ?From: Darrel Reach, CMA ?Sent: 09/18/2021   1:28 PM EDT ?To: Jenean Lindau, MD ? ?Patient want to know if it is absolutely necessary to start Toprol. She been feeling terrible with edema, she said she has a large list of med allergies and doesn't want to take more meds unless it's necessary. Please advise  ?----- Message ----- ?From: Jenean Lindau, MD ?Sent: 09/18/2021  12:07 PM EDT ?To: Truddie Hidden, RN ? ?Mildly abnormal.  Please initiate patient on Toprol-XL 25 mg daily.  Let her keep a track of her pulse blood pressure and bring it to [place] in 2 weeks.  Copy primary care. ?Jenean Lindau, MD 09/18/2021 12:07 PM  ? ? ?

## 2021-10-02 ENCOUNTER — Telehealth: Payer: Self-pay

## 2021-10-02 MED ORDER — GABAPENTIN 100 MG PO CAPS: 100.0000 mg | ORAL_CAPSULE | Freq: Three times a day (TID) | ORAL | 3 refills | Status: DC

## 2021-10-02 NOTE — Telephone Encounter (Signed)
Prescription sent for 100 mg 3 times daily as needed.  Originally sent to the incorrect pharmacy of [facility] however this has been rectified and it is now at [facility] on [address] in [place].

## 2021-10-02 NOTE — Telephone Encounter (Signed)
[facility] called to see if you can call in a refill for gabapentin but I don't see it listed in the medication list.  She also wanted me to let you know that the Medicine seems to be helping and she just wanted to thank you.

## 2021-10-02 NOTE — Telephone Encounter (Signed)
Contacted [facility] and she stated that the prescription is '100mg'$  1 by mouth 3 times daily but she mainly only takes the medication at night.  Send to  [facility] [place] Phone:  [phone]  Fax:  [phone]

## 2021-10-03 NOTE — Telephone Encounter (Signed)
Patient made aware RX sent.

## 2021-10-16 ENCOUNTER — Emergency Department (INDEPENDENT_AMBULATORY_CARE_PROVIDER_SITE_OTHER): Payer: Medicare Other

## 2021-10-16 ENCOUNTER — Emergency Department
Admission: EM | Admit: 2021-10-16 | Discharge: 2021-10-16 | Disposition: A | Discharge status: Home / Self Care | Payer: Medicare Other | Source: Home / Self Care

## 2021-10-16 DIAGNOSIS — R509 Fever, unspecified: Secondary | ICD-10-CM

## 2021-10-16 DIAGNOSIS — R053 Chronic cough: Secondary | ICD-10-CM | POA: Diagnosis not present

## 2021-10-16 DIAGNOSIS — J479 Bronchiectasis, uncomplicated: Secondary | ICD-10-CM | POA: Diagnosis not present

## 2021-10-16 DIAGNOSIS — J189 Pneumonia, unspecified organism: Secondary | ICD-10-CM

## 2021-10-16 DIAGNOSIS — R059 Cough, unspecified: Secondary | ICD-10-CM | POA: Diagnosis not present

## 2021-10-16 MED ORDER — ONDANSETRON 4 MG PO TBDP
4.0000 mg | ORAL_TABLET | Freq: Once | ORAL | Status: AC
  Administered 2021-10-16: 4 mg via ORAL

## 2021-10-16 MED ORDER — DOXYCYCLINE HYCLATE 100 MG PO CAPS: 100.0000 mg | ORAL_CAPSULE | Freq: Two times a day (BID) | ORAL | 0 refills | Status: AC

## 2021-10-16 NOTE — Discharge Instructions (Addendum)
Advised/informed patient/daughter of chest x-ray results with hard copy provided.  Advised patient to take medication as directed with food to completion.  Encouraged patient to increase daily water intake while taking this medication.  Advised patient/daughter to repeat chest x-ray in 1 month to ensure consolidation has been resolved of right upper lobe.  Advised patient/daughter if symptoms worsen and/or unresolved please follow-up with PCP for further evaluation.

## 2021-10-16 NOTE — ED Provider Notes (Signed)
[facility]    CSN: [id number] Arrival date & time: 10/16/21  1018      History   Chief Complaint Chief Complaint  Patient presents with   Cough   Fever   Back Pain   Shortness of Breath    HPI Courtney Cummings is a 81 y.o. female.   HPI pleasant 81 year old female presents with cough, shortness of breath and intermittent fever for 3 weeks.  Reports no recent COVID test and development of lower back pain yesterday.  PMH significant for TIA, COPD, and CAD (s/p MI, LBBB).  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 sees dr. love   Chronic depression 04/08/2020   Formatting of this note might be different from the original. sees dr. 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 sees dr. Hazle Nordmann   Interstitial lung disease ([place])    Irregular heart rhythm 04/08/2020   Formatting of this note might be different from the original. seen by dr. 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   Nuclear sclerotic cataract of left eye 06/25/2016   Osteoarthrosis 10/22/2012   Other nonspecific abnormal finding of lung field 03/02/2013   Personal history of malignant neoplasm of breast 09/23/2011   Prediabetes 04/08/2020   Recurrent falls 01/07/2017   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   Right-sided headache 04/02/2021   Statin intolerance 07/21/2017   Transient ischemic attack (TIA) 03/04/2017   Trigeminal neuralgia of right side of face 03/25/2021    Patient Active Problem List   Diagnosis Date Noted   Abnormal EKG 08/28/2021   Palpitations 08/28/2021   Cardiac murmur 08/28/2021   Colon polyps 08/26/2021   COPD (chronic obstructive pulmonary disease) ([place]) 08/26/2021   DVT (deep venous thrombosis) ([place]) 08/26/2021   Heart attack ([place]) 08/26/2021   Interstitial lung disease ([place]) 08/26/2021   Kidney disease 08/26/2021   MRSA (methicillin resistant Staphylococcus aureus) 08/26/2021   Right-sided headache 04/02/2021   Trigeminal neuralgia of right side of face 03/25/2021   Acute right eye pain 03/25/2021   Chronic anxiety 04/08/2020   Chronic depression 04/08/2020   History of shingles 04/08/2020  Irregular heart rhythm 04/08/2020   Prediabetes 04/08/2020   Idiopathic peripheral neuropathy 01/30/2020   Major depressive disorder, recurrent, 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 05/16/2018   LBBB (left bundle branch block) 09/22/2017   B12 deficiency 08/24/2017   Statin intolerance 07/21/2017   Multiple drug  hypersensitivity syndrome 05/17/2017   Transient ischemic attack (TIA) 03/04/2017   History of hysterectomy 02/09/2017   History of colon polyps 01/13/2017   Recurrent falls 01/07/2017   History of recurrent UTIs 11/19/2016   Nuclear sclerotic cataract of left eye 06/25/2016   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   Asthma, mild persistent 03/02/2013   Other nonspecific abnormal finding of lung field 03/02/2013   Allergy status to unspecified drugs, medicaments and biological substances 02/23/2013   Generalized anxiety disorder 11/15/2012   Cognitive disorder 10/22/2012   Dizziness 10/22/2012   Essential hypertension 10/22/2012   Gastroesophageal reflux disease without esophagitis 10/22/2012   Hyperlipidemia 10/22/2012   Osteoarthrosis 10/22/2012   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 ([place]) 08/09/2012   Personal history of malignant neoplasm of breast 09/23/2011    Past Surgical History:  Procedure Laterality Date   ABDOMINAL HYSTERECTOMY     APPENDECTOMY     BREAST LUMPECTOMY     CHOLECYSTECTOMY      OB History   No obstetric history on file.      Home Medications    Prior to Admission medications   Medication Sig Start Date End Date Taking? Authorizing Provider  doxycycline (VIBRAMYCIN) 100 MG capsule Take 1 capsule (100 mg total) by mouth 2 (two) times daily for 10 days. 10/16/21 10/26/21 Yes Eliezer Lofts, FNP  acetaminophen (TYLENOL) 325 MG tablet Take 650 mg by mouth every 6 (six) hours as needed for pain or headache.    [provider]  albuterol (VENTOLIN HFA) 108 (90 Base) MCG/ACT inhaler Inhale 2 puffs into the lungs every 4 (four) hours as needed for wheezing or shortness of breath. 04/08/20   Samuel Bouche, NP  amLODipine (NORVASC) 2.5 MG tablet Take 1 tablet (2.5 mg total) by mouth daily. DUE FOR FOLLOW UP APPT 07/06/21   Samuel Bouche, NP  celecoxib (CELEBREX) 100 MG  capsule Take 1 capsule (100 mg total) by mouth 2 (two) times daily. 09/12/21   Samuel Bouche, NP  docusate sodium (COLACE) 100 MG capsule Take 100 mg by mouth daily as needed for mild constipation.    [provider]  gabapentin (NEURONTIN) 100 MG capsule Take 1 capsule (100 mg total) by mouth 3 (three) times daily. 10/02/21   Samuel Bouche, NP  LORazepam (ATIVAN) 1 MG tablet Take 1 tablet (1 mg total) by mouth 2 (two) times daily as needed (Use very sparingly). 08/23/20   Samuel Bouche, NP  meclizine (ANTIVERT) 25 MG tablet Take 1 tablet (25 mg total) by mouth 3 (three) times daily as needed for dizziness or nausea. 09/24/20   Samuel Bouche, NP  Multiple Vitamin (MULTI-VITAMIN) tablet Take 1 tablet by mouth daily.    [provider]  omeprazole (PRILOSEC) 40 MG capsule TAKE 1 CAPSULE(40 MG) BY MOUTH DAILY 03/17/21   Samuel Bouche, NP  sertraline (ZOLOFT) 50 MG tablet Take '50mg'$  (1 tablet) every morning and '25mg'$  (1/2 tablet) every evening for 1 week then increase to '50mg'$  twice daily. 12/25/20   Samuel Bouche, NP  traMADol (ULTRAM) 50 MG tablet Take 1 tablet (50 mg total) by  mouth every 6 (six) hours as needed. 09/12/21   Samuel Bouche, NP    Family History Family History  Problem Relation Age of Onset   Hypertension Mother    Heart attack Mother    Diabetes Mother    Stroke Mother    Ovarian cancer Mother    Hypertension Father    Diabetes Father    Tuberculosis Father    Hypertension Brother    Diabetes Brother    Pulmonary fibrosis Brother    Diabetes Son    Hypertension Paternal Aunt    Hypertension Paternal Uncle    Hypertension Paternal Grandmother    Diabetes Paternal Grandmother    Hypertension Paternal Grandfather    Diabetes Paternal Grandfather    Hypertension Brother    Diabetes Brother    Hypertension Brother    Diabetes Brother     Social History Social History   Tobacco Use   Smoking status: Former    Packs/day: 0.50    Types: Cigarettes    Quit date: 04/08/1990     Years since quitting: 31.5   Smokeless tobacco: Never  Vaping Use   Vaping Use: Never used  Substance Use Topics   Alcohol use: Yes    Comment: occasionally   Drug use: Never     Allergies   Diltiazem, Iodinated contrast media, Iodine, Iodine-131, Levalbuterol hcl, Meperidine hcl, Mometasone, Quinolones, Tape, Tiotropium, Umeclidinium, Asmanex (120 metered doses) [mometasone furoate], Azithromycin, Elemental sulfur, Statins, Sulfa antibiotics, Sulfasalazine, Valacyclovir, Celebrex [celecoxib], Codeine, Diltiazem hcl, Fluticasone furoate-vilanterol, Latex, Lisinopril, and Pantoprazole   Review of Systems Review of Systems  Constitutional:  Positive for fever.  Respiratory:  Positive for cough and shortness of breath.   Musculoskeletal:  Positive for back pain.  All other systems reviewed and are negative.   Physical Exam Triage Vital Signs ED Triage Vitals  Enc Vitals Group     BP 10/16/21 1036 128/70     Pulse Rate 10/16/21 1036 69     Resp 10/16/21 1036 (!) 24     Temp 10/16/21 1036 98.5 F (36.9 C)     Temp Source 10/16/21 1036 Oral     SpO2 10/16/21 1036 96 %     Weight 10/16/21 1035 175 lb (79.4 kg)     Height 10/16/21 1035 '5\' 8"'$  (1.727 m)     Head Circumference --      Peak Flow --      Pain Score 10/16/21 1034 5     Pain Loc --      Pain Edu? --      Excl. in [place]? --    No data found.  Updated Vital Signs BP 128/70 (BP Location: Left Arm)   Pulse 69   Temp 98.5 F (36.9 C) (Oral)   Resp (!) 24   Ht '5\' 8"'$  (1.727 m)   Wt 175 lb (79.4 kg)   SpO2 96%   BMI 26.61 kg/m      Physical Exam Vitals and nursing note reviewed.  Constitutional:      Appearance: She is well-developed. She is obese.  HENT:     Head: Normocephalic and atraumatic.     Mouth/Throat:     Mouth: Mucous membranes are moist.     Pharynx: Oropharynx is clear.  Eyes:     Extraocular Movements: Extraocular movements intact.     Pupils: Pupils are equal, round, and reactive to  light.  Cardiovascular:     Rate and Rhythm: Normal rate and regular rhythm.  Pulses: Normal pulses.     Heart sounds: Normal heart sounds.  Pulmonary:     Effort: Tachypnea present.     Breath sounds: Examination of the right-upper field reveals decreased breath sounds. Examination of the left-upper field reveals decreased breath sounds. Examination of the right-middle field reveals decreased breath sounds. Examination of the right-lower field reveals decreased breath sounds. Examination of the left-lower field reveals decreased breath sounds. Decreased breath sounds present.  Musculoskeletal:     Cervical back: Normal range of motion and neck supple.  Skin:    General: Skin is warm and dry.  Neurological:     General: No focal deficit present.     Mental Status: She is alert and oriented to person, place, and time.     UC Treatments / Results  Labs (all labs ordered are listed, but only abnormal results are displayed) Labs Reviewed  POCT URINALYSIS DIP (MANUAL ENTRY)    EKG   Radiology DG Chest 2 View  Result Date: 10/16/2021 CLINICAL DATA:  Cough and fever over the last 3 weeks. History of breast cancer. EXAM: CHEST - 2 VIEW COMPARISON:  04/09/2021 chest CT, and chest radiograph from 03/27/2021 FINDINGS: Progressive airspace opacity and mild nodularity in the left mid lung, substantially increased compared to the prior fibrosis and bronchiectasis shown in the left upper lobe/lingula on the 04/09/2021 CT, raising suspicion for pneumonia. The right lung appears clear. Cardiac and mediastinal margins appear normal. No blunting of the costophrenic angles. IMPRESSION: 1. Progressive airspace opacity and mild nodularity in the left upper lobe, substantially increased compared to the background fibrosis and bronchiectasis shown on 04/09/2021, and suspicious for pneumonia. Followup PA and lateral chest X-ray is recommended in 3-4 weeks following trial of antibiotic therapy to ensure  resolution and exclude underlying malignancy. Alternatively, chest CT could be utilized for further characterization. Electronically Signed   By: Van Clines M.D.   On: 10/16/2021 11:16    Procedures Procedures (including critical care time)  Medications Ordered in UC Medications  ondansetron (ZOFRAN-ODT) disintegrating tablet 4 mg (4 mg Oral Given 10/16/21 1135)    Initial Impression / Assessment and Plan / UC Course  I have reviewed the triage vital signs and the nursing notes.  Pertinent labs & imaging results that were available during my care of the patient were reviewed by me and considered in my medical decision making (see chart for details).     MDM: 1.  Acquired pneumonia of right upper lobe-Rx'd Doxycycline. Advised/informed patient/daughter of chest x-ray results with hard copy provided.  Advised patient to take medication as directed with food to completion.  Encouraged patient to increase daily water intake while taking this medication.  Advised patient/daughter to repeat chest x-ray in 1 month to ensure consolidation has been resolved of right upper lobe.  Advised patient/daughter if symptoms worsen and/or unresolved please follow-up with PCP for further evaluation.  Patient discharged home, hemodynamically stable. Final Clinical Impressions(s) / UC Diagnoses   Final diagnoses:  Community acquired pneumonia of right upper lobe of lung     Discharge Instructions      Advised/informed patient/daughter of chest x-ray results with hard copy provided.  Advised patient to take medication as directed with food to completion.  Encouraged patient to increase daily water intake while taking this medication.  Advised patient/daughter to repeat chest x-ray in 1 month to ensure consolidation has been resolved of right upper lobe.  Advised patient/daughter if symptoms worsen and/or unresolved please follow-up with PCP for  further evaluation.     ED Prescriptions      Medication Sig Dispense Auth. Provider   doxycycline (VIBRAMYCIN) 100 MG capsule Take 1 capsule (100 mg total) by mouth 2 (two) times daily for 10 days. 20 capsule Eliezer Lofts, FNP      PDMP not reviewed this encounter.   Eliezer Lofts, [address] 10/16/21 1227

## 2021-10-16 NOTE — ED Triage Notes (Signed)
Pt presents to Urgent Care with c/o cough, sob, and intermittent fever x 3 weeks. No recent COVID test. Also developed lower back pain yesterday.

## 2021-10-22 ENCOUNTER — Ambulatory Visit: Payer: Medicare Other | Admitting: Physician Assistant

## 2021-10-31 ENCOUNTER — Ambulatory Visit (INDEPENDENT_AMBULATORY_CARE_PROVIDER_SITE_OTHER): Payer: Medicare Other | Admitting: Physician Assistant

## 2021-10-31 ENCOUNTER — Ambulatory Visit (INDEPENDENT_AMBULATORY_CARE_PROVIDER_SITE_OTHER): Payer: Medicare Other

## 2021-10-31 ENCOUNTER — Encounter: Payer: Self-pay | Admitting: Physician Assistant

## 2021-10-31 VITALS — BP 106/72 | HR 82 | Temp 98.8°F | Ht 68.0 in | Wt 171.0 lb

## 2021-10-31 DIAGNOSIS — J189 Pneumonia, unspecified organism: Secondary | ICD-10-CM | POA: Diagnosis not present

## 2021-10-31 DIAGNOSIS — R531 Weakness: Secondary | ICD-10-CM | POA: Insufficient documentation

## 2021-10-31 DIAGNOSIS — R052 Subacute cough: Secondary | ICD-10-CM | POA: Diagnosis not present

## 2021-10-31 DIAGNOSIS — R0602 Shortness of breath: Secondary | ICD-10-CM | POA: Insufficient documentation

## 2021-10-31 DIAGNOSIS — J849 Interstitial pulmonary disease, unspecified: Secondary | ICD-10-CM

## 2021-10-31 DIAGNOSIS — I10 Essential (primary) hypertension: Secondary | ICD-10-CM | POA: Diagnosis not present

## 2021-10-31 LAB — POCT URINALYSIS DIP (CLINITEK)
Bilirubin, UA: NEGATIVE
Blood, UA: NEGATIVE
Glucose, UA: NEGATIVE mg/dL
Ketones, POC UA: NEGATIVE mg/dL
Nitrite, UA: NEGATIVE
POC PROTEIN,UA: NEGATIVE
Spec Grav, UA: 1.015 (ref 1.010–1.025)
Urobilinogen, UA: 0.2 E.U./dL
pH, UA: 5.5 (ref 5.0–8.0)

## 2021-10-31 MED ORDER — ALBUTEROL SULFATE (2.5 MG/3ML) 0.083% IN NEBU: 2.5000 mg | INHALATION_SOLUTION | RESPIRATORY_TRACT | 2 refills | Status: DC | PRN

## 2021-10-31 MED ORDER — NUZYRA 150 MG PO TABS: ORAL_TABLET | ORAL | 0 refills | Status: DC

## 2021-10-31 MED ORDER — PREDNISONE 50 MG PO TABS: ORAL_TABLET | ORAL | 0 refills | Status: DC

## 2021-10-31 NOTE — Patient Instructions (Addendum)
Get stat CXR today and we will call with result Ordered new nebulizer to use as needed to get better results than inhaler Will get labs   Community-Acquired Pneumonia, Adult Pneumonia is an infection of the lungs. It causes irritation and swelling in the airways of the lungs. Mucus and fluid may also build up inside the airways. This may cause coughing and trouble breathing. One type of pneumonia can happen while you are in a hospital. A different type can happen when you are not in a hospital (community-acquired pneumonia). What are the causes?  This condition is caused by germs (viruses, bacteria, or fungi). Some types of germs can spread from person to person. Pneumonia is not thought to spread from person to person. What increases the risk? You are more likely to develop this condition if: You have a long-term (chronic) disease, such as: Disease of the lungs. This may be chronic obstructive pulmonary disease (COPD) or asthma. Heart failure. Cystic fibrosis. Diabetes. Kidney disease. Sickle cell disease. HIV. You have other health problems, such as: Your body's defense system (immune system) is weak. A condition that may cause you to breathe in fluids from your mouth and nose. You had your spleen taken out. You do not take good care of your teeth and mouth (poor dental hygiene). You use or have used tobacco products. You travel where the germs that cause this illness are common. You are near certain animals or the places they live. You are older than 80 years of age. What are the signs or symptoms? Symptoms of this condition include: A cough. A fever. Sweating or chills. Chest pain, often when you breathe deeply or cough. Breathing problems, such as: Fast breathing. Trouble breathing. Shortness of breath. Feeling tired (fatigued). Muscle aches. How is this treated? Treatment for this condition depends on many things, such as: The cause of your illness. Your  medicines. Your other health problems. Most adults can be treated at home. Sometimes, treatment must happen in a hospital. Treatment may include medicines to kill germs. Medicines may depend on which germ caused your illness. Very bad pneumonia is rare. If you get it, you may: Have a machine to help you breathe. Have fluid taken away from around your lungs. Follow these instructions at home: Medicines Take over-the-counter and prescription medicines only as told by your doctor. Take cough medicine only if you are losing sleep. Cough medicine can keep your body from taking mucus away from your lungs. If you were prescribed an antibiotic medicine, take it as told by your doctor. Do not stop taking the antibiotic even if you start to feel better. Lifestyle     Do not drink alcohol. Do not use any products that contain nicotine or tobacco, such as cigarettes, e-cigarettes, and chewing tobacco. If you need help quitting, ask your doctor. Eat a healthy diet. This includes a lot of vegetables, fruits, whole grains, low-fat dairy products, and low-fat (lean) protein. General instructions  Rest a lot. Sleep for at least 8 hours each night. Sleep with your head and neck raised. Put a few pillows under your head or sleep in a reclining chair. Return to your normal activities as told by your doctor. Ask your doctor what activities are safe for you. Drink enough fluid to keep your pee (urine) pale yellow. If your throat is sore, rinse your mouth often with salt water. To make salt water, dissolve -1 tsp (3-6 g) of salt in 1 cup (237 mL) of warm water. Keep all  follow-up visits as told by your doctor. This is important. How is this prevented? You can lower your risk of pneumonia by: Getting the pneumonia shot (vaccine). These shots have different types and schedules. Ask your doctor what works best for you. Think about getting this shot if: You are older than 80 years of age. You are 19-65 years  of age and: You are being treated for cancer. You have long-term lung disease. You have other problems that affect your body's defense system. Ask your doctor if you have one of these. Getting your flu shot every year. Ask your doctor which type of shot is best for you. Going to the dentist as often as told. Washing your hands often with soap and water for at least 20 seconds. If you cannot use soap and water, use hand sanitizer. Contact a doctor if: You have a fever. You lose sleep because your cough medicine does not help. Get help right away if: You are short of breath and this gets worse. You have more chest pain. Your sickness gets worse. This is very serious if: You are an older adult. Your body's defense system is weak. You cough up blood. These symptoms may be an emergency. Do not wait to see if the symptoms will go away. Get medical help right away. Call your local emergency services (911 in the [place] Do not drive yourself to the hospital. Summary Pneumonia is an infection of the lungs. Community-acquired pneumonia affects people who have not been in the hospital. Certain germs can cause this infection. This condition may be treated with medicines that kill germs. For very bad pneumonia, you may need a hospital stay and treatment to help with breathing. This information is not intended to replace advice given to you by your health care provider. Make sure you discuss any questions you have with your health care provider. Document Revised: 02/14/2019 Document Reviewed: 02/14/2019 Elsevier Patient Education  [place].

## 2021-10-31 NOTE — Progress Notes (Signed)
Acute Office Visit  Subjective:     Patient ID: Courtney Cummings, female    DOB: 1940/11/24, 81 y.o.   MRN: [id number]  No chief complaint on file.   HPI Patient is in today to follow up on CAP. She went to UC on 10/16/2021 and CXR showed left upper quadrant pneumonia. She was given doxycycline. She has finished doxycycline and not better. She presents to the clinic with her daughter. She is very weak and short of breath. She eats and drinks some but does not have any appetite. Her cough is dry to productive. She uses albuterol inhaler but does not have nebulizer. No urinary symptoms.   She does not want to go to pulmonologist in [facility] system and request another referral.  .. 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   Essential hypertension 10/22/2012   Gastroesophageal reflux disease without esophagitis 10/22/2012   Generalized anxiety disorder 11/15/2012   History of colon polyps 01/13/2017   History of hysterectomy 02/09/2017   History of recurrent UTIs 11/19/2016   History of shingles 04/08/2020   Hyperlipidemia 10/22/2012   Idiopathic peripheral neuropathy 01/30/2020   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 05/16/2018   Irregular heart rhythm 04/08/2020   LBBB (left bundle branch block) 09/22/2017   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 ([place]) 08/09/2012   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   Nuclear sclerotic cataract of left eye 06/25/2016   Osteoarthrosis 10/22/2012   Other nonspecific abnormal finding of lung field 03/02/2013   Personal history of malignant neoplasm of breast 09/23/2011   Recurrent falls 01/07/2017    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   Statin intolerance 07/21/2017   Transient ischemic attack (TIA) 03/04/2017   Prediabetes 04/08/2020   Major depressive disorder, recurrent, 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   Trigeminal neuralgia of right side of face 03/25/2021   Acute right eye pain 03/25/2021   Right-sided headache 04/02/2021   Colon polyps 08/26/2021   COPD (chronic obstructive pulmonary disease) ([place]) 08/26/2021   DVT (deep venous thrombosis) ([place]) 08/26/2021   Heart attack ([place]) 08/26/2021   ILD (interstitial lung disease) ([place]) 08/26/2021   Kidney disease 08/26/2021   MRSA (methicillin resistant Staphylococcus aureus) 08/26/2021   Abnormal EKG 08/28/2021   Palpitations 08/28/2021   Cardiac murmur 08/28/2021   Pneumonia of right upper lobe due to infectious organism 10/31/2021   Weakness 10/31/2021   Resolved Ambulatory Problems    Diagnosis Date Noted   No Resolved Ambulatory Problems   Past Medical History:  Diagnosis Date   Interstitial lung disease ([place])      ROS  See HPI.    Objective:    BP 106/72   Pulse 82   Temp 98.8 F (37.1 C) (Oral)   Ht '5\' 8"'$  (1.727 m)   Wt 171 lb (77.6 kg)   SpO2 95%   BMI 26.00 kg/m  BP Readings from Last 3 Encounters:  10/31/21 106/72  10/16/21 128/70  09/16/21 (!) 160/80   Wt Readings from Last 3 Encounters:  10/31/21 171 lb (77.6 kg)  10/16/21 175 lb (79.4 kg)  09/16/21 170 lb (77.1 kg)      Physical Exam Vitals reviewed.  Constitutional:      Appearance: Normal appearance. She is ill-appearing.  HENT:     Head: Normocephalic.  Cardiovascular:     Rate and Rhythm: Normal rate and regular rhythm.  Pulmonary:     Effort: Pulmonary effort is normal.     Comments: Rales bilateral lungs more prominent at base  Rhonchi right upper lobe Musculoskeletal:     Right lower leg: No edema.     Left lower leg: No edema.  Skin:    Coloration: Skin is pale.   Neurological:     General: No focal deficit present.     Mental Status: She is alert and oriented to person, place, and time.  Psychiatric:        Mood and Affect: Mood normal.     .. Results for orders placed or performed in visit on 10/31/21  POCT URINALYSIS DIP (CLINITEK)  Result Value Ref Range   Color, UA yellow yellow   Clarity, UA clear clear   Glucose, UA negative negative mg/dL   Bilirubin, UA negative negative   Ketones, POC UA negative negative mg/dL   Spec Grav, UA 1.015 1.010 - 1.025   Blood, UA negative negative   pH, UA 5.5 5.0 - 8.0   POC PROTEIN,UA negative negative, trace   Urobilinogen, UA 0.2 0.2 or 1.0 E.U./dL   Nitrite, UA Negative Negative   Leukocytes, UA Trace (A) Negative          Assessment & Plan:  Marland KitchenMarland KitchenDiagnoses and all orders for this visit:  Pneumonia of right upper lobe due to infectious organism -     DG Chest 2 View; Future -     albuterol (PROVENTIL) (2.5 MG/3ML) 0.083% nebulizer solution; Take 3 mLs (2.5 mg total) by nebulization every 4 (four) hours as needed for wheezing or shortness of breath (please include nebulizer machine, hoses, and mask if needed.). -     CBC w/Diff/Platelet -     BASIC METABOLIC PANEL WITH GFR  ILD (interstitial lung disease) ([place]) -     For home use only DME Nebulizer machine -     albuterol (PROVENTIL) (2.5 MG/3ML) 0.083% nebulizer solution; Take 3 mLs (2.5 mg total) by nebulization every 4 (four) hours as needed for wheezing or shortness of breath (please include nebulizer machine, hoses, and mask if needed.). -     CBC w/Diff/Platelet -     BASIC METABOLIC PANEL WITH GFR  Weakness -     CBC w/Diff/Platelet -     BASIC METABOLIC PANEL WITH GFR   Pt continues to feel weak, cough, SOB Ongoing ILD- will make new referral RUL pneumonia 10/16/2021 will get stat CXR to determine if improving  Pulse ox 95 percent after albuterol nebulizer improved to 98 percent Albuterol nebulizer sent for home use CBC/BMP  ordered for weakness UA showed trace leuks  Will culture No UA symptoms  CXR showed persistent and worsening opacities in Left upper lobe Will order CT of chest Start Nuzyra for 12 days.  Start prednisone taper  Follow up if not improving or symptoms worsening. 2 weeks with PCP.     Return in about 2 weeks (around 11/14/2021), or if symptoms worsen or fail to improve.  Iran Planas, PA-C

## 2021-10-31 NOTE — Progress Notes (Signed)
Sent nuzyra to [facility] and prednisone. Please make sure she can get both before the weekend.   Persistent opacities. Needs CT of lungs as well but not urgent can do next week.

## 2021-11-01 LAB — CBC WITH DIFFERENTIAL/PLATELET
Absolute Monocytes: 431 cells/uL (ref 200–950)
Basophils Absolute: 51 cells/uL (ref 0–200)
Basophils Relative: 0.7 %
Eosinophils Absolute: 241 cells/uL (ref 15–500)
Eosinophils Relative: 3.3 %
HCT: 41.4 % (ref 35.0–45.0)
Hemoglobin: 13.9 g/dL (ref 11.7–15.5)
Lymphs Abs: 2686 cells/uL (ref 850–3900)
MCH: 30 pg (ref 27.0–33.0)
MCHC: 33.6 g/dL (ref 32.0–36.0)
MCV: 89.2 fL (ref 80.0–100.0)
MPV: 10.1 fL (ref 7.5–12.5)
Monocytes Relative: 5.9 %
Neutro Abs: 3891 cells/uL (ref 1500–7800)
Neutrophils Relative %: 53.3 %
Platelets: 259 10*3/uL (ref 140–400)
RBC: 4.64 10*6/uL (ref 3.80–5.10)
RDW: 13 % (ref 11.0–15.0)
Total Lymphocyte: 36.8 %
WBC: 7.3 10*3/uL (ref 3.8–10.8)

## 2021-11-01 LAB — BASIC METABOLIC PANEL WITH GFR
BUN: 12 mg/dL (ref 7–25)
CO2: 22 mmol/L (ref 20–32)
Calcium: 9.3 mg/dL (ref 8.6–10.4)
Chloride: 106 mmol/L (ref 98–110)
Creat: 0.87 mg/dL (ref 0.60–0.95)
Glucose, Bld: 114 mg/dL — ABNORMAL HIGH (ref 65–99)
Potassium: 4.3 mmol/L (ref 3.5–5.3)
Sodium: 140 mmol/L (ref 135–146)
eGFR: 67 mL/min/{1.73_m2} (ref >=60)

## 2021-11-02 LAB — URINE CULTURE
MICRO NUMBER:: 13538991
SPECIMEN QUALITY:: ADEQUATE

## 2021-11-03 ENCOUNTER — Other Ambulatory Visit: Payer: Self-pay | Admitting: Physician Assistant

## 2021-11-03 DIAGNOSIS — I2723 Pulmonary hypertension due to lung diseases and hypoxia: Secondary | ICD-10-CM | POA: Diagnosis not present

## 2021-11-03 MED ORDER — NITROFURANTOIN MONOHYD MACRO 100 MG PO CAPS
100.0000 mg | ORAL_CAPSULE | Freq: Two times a day (BID) | ORAL | 0 refills | Status: DC
Start: 2021-11-03 — End: 2021-11-12

## 2021-11-03 NOTE — Progress Notes (Signed)
Looks like Courtney Cummings has some activity against enterococcus species.  You can certainly add the macrobid to be sure this is taken care of.  Additionally, fosfomycin is typically quite effective for non-VRE enterococcal species.

## 2021-11-03 NOTE — Progress Notes (Signed)
Enterococcus found in urine. Sent macrobid to pharmacy to take twice daily for 5 days to treat.  Kidney looks good.  WBC looks great.

## 2021-11-03 NOTE — Progress Notes (Signed)
Could you help with this? Pt has CAP that failed doxycycline and due to ILD I got [place] approved and then her urine came back with this culture. She is feeling weak and has hx of UTI but no fever or urinary symptoms. Should I leave on nuzrya and send macrobid? Or recheck Urine after finishishs nuzyra or wait for more UTI symptoms?

## 2021-11-03 NOTE — Progress Notes (Signed)
Will add macrobid for 5 days twice a day. Sent to pharmacy.

## 2021-11-04 ENCOUNTER — Ambulatory Visit (INDEPENDENT_AMBULATORY_CARE_PROVIDER_SITE_OTHER): Payer: Medicare Other

## 2021-11-04 DIAGNOSIS — J849 Interstitial pulmonary disease, unspecified: Secondary | ICD-10-CM | POA: Diagnosis not present

## 2021-11-04 DIAGNOSIS — J189 Pneumonia, unspecified organism: Secondary | ICD-10-CM

## 2021-11-04 DIAGNOSIS — R918 Other nonspecific abnormal finding of lung field: Secondary | ICD-10-CM

## 2021-11-04 DIAGNOSIS — I2510 Atherosclerotic heart disease of native coronary artery without angina pectoris: Secondary | ICD-10-CM

## 2021-11-04 DIAGNOSIS — R0602 Shortness of breath: Secondary | ICD-10-CM

## 2021-11-04 DIAGNOSIS — J479 Bronchiectasis, uncomplicated: Secondary | ICD-10-CM | POA: Diagnosis not present

## 2021-11-04 DIAGNOSIS — R052 Subacute cough: Secondary | ICD-10-CM | POA: Diagnosis not present

## 2021-11-05 ENCOUNTER — Telehealth: Payer: Self-pay | Admitting: Neurology

## 2021-11-05 NOTE — Progress Notes (Signed)
Noted coronary artery build up New interstitial changes throughout the left lung could represent infection or inflammation but likely more inflammation  Do you have appt with pulmonology yet?

## 2021-11-05 NOTE — Telephone Encounter (Signed)
Patient's daughter called stating she can not eat 4 hours before and 2 hours after antibiotics and she needs to eat with prednisone. She did not take prednisone until late yesterday and it interrupted sleep (just started prednisone yesterday). Patient's daughter advised to given prednisone in the morning with breakfast and then wait four hours and given antibiotics. She will try this.   She states mom is still weak. Hasn't started UTI medication yet.

## 2021-11-14 ENCOUNTER — Encounter: Payer: Self-pay | Admitting: Physician Assistant

## 2021-11-14 ENCOUNTER — Ambulatory Visit (INDEPENDENT_AMBULATORY_CARE_PROVIDER_SITE_OTHER): Payer: Medicare Other | Admitting: Physician Assistant

## 2021-11-14 VITALS — BP 122/58 | HR 70 | Temp 97.6°F

## 2021-11-14 DIAGNOSIS — J189 Pneumonia, unspecified organism: Secondary | ICD-10-CM | POA: Insufficient documentation

## 2021-11-14 DIAGNOSIS — B029 Zoster without complications: Secondary | ICD-10-CM | POA: Insufficient documentation

## 2021-11-14 DIAGNOSIS — E538 Deficiency of other specified B group vitamins: Secondary | ICD-10-CM

## 2021-11-14 DIAGNOSIS — J849 Interstitial pulmonary disease, unspecified: Secondary | ICD-10-CM | POA: Diagnosis not present

## 2021-11-14 DIAGNOSIS — R110 Nausea: Secondary | ICD-10-CM

## 2021-11-14 DIAGNOSIS — R531 Weakness: Secondary | ICD-10-CM

## 2021-11-14 MED ORDER — ALBUTEROL SULFATE HFA 108 (90 BASE) MCG/ACT IN AERS: 2.0000 | INHALATION_SPRAY | RESPIRATORY_TRACT | 5 refills | Status: DC | PRN

## 2021-11-14 MED ORDER — CYANOCOBALAMIN 1000 MCG/ML IJ SOLN
1000.0000 ug | Freq: Once | INTRAMUSCULAR | Status: AC
  Administered 2021-11-14: 1000 ug via INTRAMUSCULAR

## 2021-11-14 MED ORDER — LIDOCAINE 5 % EX PTCH: 1.0000 | MEDICATED_PATCH | Freq: Two times a day (BID) | CUTANEOUS | 0 refills | Status: DC

## 2021-11-14 MED ORDER — ONDANSETRON 8 MG PO TBDP: 8.0000 mg | ORAL_TABLET | Freq: Three times a day (TID) | ORAL | 1 refills | Status: DC | PRN

## 2021-11-14 MED ORDER — VALACYCLOVIR HCL 1 G PO TABS: 1000.0000 mg | ORAL_TABLET | Freq: Three times a day (TID) | ORAL | 0 refills | Status: DC

## 2021-11-14 NOTE — Patient Instructions (Signed)

## 2021-11-14 NOTE — Progress Notes (Signed)
Acute Office Visit  Subjective:     Patient ID: Courtney Cummings, female    DOB: [birth year], 81 y.o.   MRN: [id number]  Chief Complaint  Patient presents with   Follow-up    Pneumonia, now with itchy, painful rash on bottom    HPI Patient is in today for follow up on pneumonia. She is accompanied by her daughter. Pt has ILD and has not heard from pulmonology about referral placed on 10/31/2021. Pt has finished her antibiotic and feels much better with breathing. Denies any coughing, fever, chills, body aches. She continues to be weak and tired.   She does report now a vesicular rash on erythematous base of left buttocks that is painful and itchy. She has had shingles before.   .. 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   Essential hypertension 10/22/2012   Gastroesophageal reflux disease without esophagitis 10/22/2012   Generalized anxiety disorder 11/15/2012   History of colon polyps 01/13/2017   History of hysterectomy 02/09/2017   History of recurrent UTIs 11/19/2016   History of shingles 04/08/2020   Hyperlipidemia 10/22/2012   Idiopathic peripheral neuropathy 01/30/2020   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 05/16/2018   Irregular heart rhythm 04/08/2020   LBBB (left bundle branch block) 09/22/2017   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 ([place]) 08/09/2012   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   Nuclear sclerotic cataract of left eye 06/25/2016   Osteoarthrosis 10/22/2012   Other nonspecific abnormal finding of lung field 03/02/2013   Personal history of malignant neoplasm of breast 09/23/2011   Recurrent falls  01/07/2017   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   Statin intolerance 07/21/2017   Transient ischemic attack (TIA) 03/04/2017   Prediabetes 04/08/2020   Major depressive disorder, recurrent, 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   Trigeminal neuralgia of right side of face 03/25/2021   Acute right eye pain 03/25/2021   Right-sided headache 04/02/2021   Colon polyps 08/26/2021   COPD (chronic obstructive pulmonary disease) ([place]) 08/26/2021   DVT (deep venous thrombosis) ([place]) 08/26/2021   Heart attack ([place]) 08/26/2021   ILD (interstitial lung disease) ([place]) 08/26/2021   Kidney disease 08/26/2021   MRSA (methicillin resistant Staphylococcus aureus) 08/26/2021   Abnormal EKG 08/28/2021   Palpitations 08/28/2021   Cardiac murmur 08/28/2021   Pneumonia of right upper lobe due to infectious organism 10/31/2021   Weakness 10/31/2021   Subacute cough 10/31/2021   Shortness of breath 10/31/2021   Herpes zoster without complication 01/75/1025   Pneumonia of left upper lobe due to infectious organism 11/14/2021   Resolved Ambulatory Problems    Diagnosis Date Noted   No Resolved Ambulatory Problems   Past Medical History:  Diagnosis Date   Interstitial lung disease ([place])     ROS  See HPI.     Objective:    BP (!) 122/58   Pulse 70   Temp 97.6 F (36.4 C)   SpO2 97%  BP Readings from Last 3 Encounters:  11/14/21 (!) 122/58  10/31/21 106/72  10/16/21 128/70      Physical Exam Constitutional:      Appearance: Normal appearance.     Comments: In a  wheelchair  HENT:     Head: Normocephalic.     Right Ear: Tympanic membrane, ear canal and external ear normal. There is no impacted cerumen.     Left Ear: Tympanic membrane, ear canal and external ear normal. There is no impacted cerumen.     Nose: Nose normal.     Mouth/Throat:     Mouth: Mucous membranes are moist.  Eyes:     Conjunctiva/sclera: Conjunctivae normal.   Cardiovascular:     Rate and Rhythm: Normal rate and regular rhythm.     Pulses: Normal pulses.     Heart sounds: Normal heart sounds.  Pulmonary:     Effort: Pulmonary effort is normal.     Comments: Coarse breath sounds at base. Scattered rales throughout both lungs.  Musculoskeletal:     Right lower leg: No edema.     Left lower leg: No edema.  Skin:    Comments: Vesicular rash on erythematous base scattered clusters on left buttocks.   Neurological:     General: No focal deficit present.     Mental Status: She is alert and oriented to person, place, and time.  Psychiatric:        Mood and Affect: Mood normal.          Assessment & Plan:  Marland KitchenMarland KitchenMyra was seen today for follow-up.  Diagnoses and all orders for this visit:  Herpes zoster without complication -     valACYclovir (VALTREX) 1000 MG tablet; Take 1 tablet (1,000 mg total) by mouth 3 (three) times daily. For 7 days. -     lidocaine (LIDODERM) 5 %; Place 1 patch onto the skin every 12 (twelve) hours. Remove & Discard patch within 12 hours or as directed by MD  ILD (interstitial lung disease) ([place]) -     albuterol (VENTOLIN HFA) 108 (90 Base) MCG/ACT inhaler; Inhale 2 puffs into the lungs every 4 (four) hours as needed for wheezing or shortness of breath.  Pneumonia of left upper lobe due to infectious organism  Weakness  B12 deficiency -     cyanocobalamin ((VITAMIN B-12)) injection 1,000 mcg  Nausea -     ondansetron (ZOFRAN-ODT) 8 MG disintegrating tablet; Take 1 tablet (8 mg total) by mouth every 8 (eight) hours as needed for nausea.    Lungs do sound better but continue to hear coarse breath sounds and crackles Printed off number for pulmonology to call and get appt since referral was made on the 16th. Albuterol for rescue was refilled.  B12 was low in labs. Shot given in office today.   Zofran refilled for as needed use for nausea.   Rash does appear to be shingles. Start valtrex Lidoderm patch  given Discussed cool compresses She has tramadol for pain Follow up as needed or if symptoms worsen  Iran Planas, PA-C

## 2021-11-17 ENCOUNTER — Telehealth: Payer: Self-pay

## 2021-11-17 NOTE — Telephone Encounter (Addendum)
Initiated Prior authorization [id number] 5% patches Via: Covermymeds Case/Key: [id number] Status: approved  as of 11/17/21 Reason:Effective from 11/17/2021 through 11/18/2022. Notified Pt via: Mychart

## 2021-11-21 ENCOUNTER — Encounter: Payer: Self-pay | Admitting: Physician Assistant

## 2021-11-27 ENCOUNTER — Ambulatory Visit: Payer: Medicare Other | Admitting: Adult Health

## 2021-11-27 ENCOUNTER — Encounter: Payer: Self-pay | Admitting: Adult Health

## 2021-11-27 VITALS — BP 106/70 | HR 80 | Temp 97.7°F | Ht 67.0 in | Wt 172.6 lb

## 2021-11-27 DIAGNOSIS — J4530 Mild persistent asthma, uncomplicated: Secondary | ICD-10-CM | POA: Diagnosis not present

## 2021-11-27 DIAGNOSIS — J849 Interstitial pulmonary disease, unspecified: Secondary | ICD-10-CM

## 2021-11-27 DIAGNOSIS — J479 Bronchiectasis, uncomplicated: Secondary | ICD-10-CM | POA: Diagnosis not present

## 2021-11-27 LAB — BASIC METABOLIC PANEL
BUN: 15 mg/dL (ref 6–23)
CO2: 23 mEq/L (ref 19–32)
Calcium: 9.2 mg/dL (ref 8.4–10.5)
Chloride: 106 mEq/L (ref 96–112)
Creatinine, Ser: 0.84 mg/dL (ref 0.40–1.20)
GFR: 65.72 mL/min (ref >60.00)
Glucose, Bld: 101 mg/dL — ABNORMAL HIGH (ref 70–99)
Potassium: 3.8 mEq/L (ref 3.5–5.1)
Sodium: 137 mEq/L (ref 135–145)

## 2021-11-27 LAB — CBC WITH DIFFERENTIAL/PLATELET
Basophils Absolute: 0 10*3/uL (ref 0.0–0.1)
Basophils Relative: 0.7 % (ref 0.0–3.0)
Eosinophils Absolute: 0.3 10*3/uL (ref 0.0–0.7)
Eosinophils Relative: 3.9 % (ref 0.0–5.0)
HCT: 40.4 % (ref 36.0–46.0)
Hemoglobin: 13.3 g/dL (ref 12.0–15.0)
Lymphocytes Relative: 29.7 % (ref 12.0–46.0)
Lymphs Abs: 1.9 10*3/uL (ref 0.7–4.0)
MCHC: 33 g/dL (ref 30.0–36.0)
MCV: 90.1 fl (ref 78.0–100.0)
Monocytes Absolute: 0.3 10*3/uL (ref 0.1–1.0)
Monocytes Relative: 4.2 % (ref 3.0–12.0)
Neutro Abs: 4 10*3/uL (ref 1.4–7.7)
Neutrophils Relative %: 61.5 % (ref 43.0–77.0)
Platelets: 296 10*3/uL (ref 150.0–400.0)
RBC: 4.48 Mil/uL (ref 3.87–5.11)
RDW: 14.8 % (ref 11.5–15.5)
WBC: 6.4 10*3/uL (ref 4.0–10.5)

## 2021-11-27 LAB — HEPATIC FUNCTION PANEL
ALT: 21 U/L (ref 0–35)
AST: 24 U/L (ref 0–37)
Albumin: 4.2 g/dL (ref 3.5–5.2)
Alkaline Phosphatase: 97 U/L (ref 39–117)
Bilirubin, Direct: 0 mg/dL (ref 0.0–0.3)
Total Bilirubin: 0.4 mg/dL (ref 0.2–1.2)
Total Protein: 7.3 g/dL (ref 6.0–8.3)

## 2021-11-27 LAB — BRAIN NATRIURETIC PEPTIDE: Pro B Natriuretic peptide (BNP): 19 pg/mL (ref 0.0–100.0)

## 2021-11-27 NOTE — Progress Notes (Signed)
$'@Patient'E$  ID: Courtney Cummings, female    DOB: 01-02-1941, 81 y.o.   MRN: [id number]  Chief Complaint  Patient presents with   Consult    Referring provider: Samuel Bouche, NP  HPI: 81 year old female former smoker seen for pulmonary consult November 27, 2021 to establish for interstitial lung disease, bronchiectasis and asthma Medical history significant for hypertension, coronary artery disease, interstitial cystitis, asthma  TEST/EVENTS :  CT chest November 04, 2021 shows new scattered groundglass airspace opacities throughout the left lung nonspecific..  Infectious versus inflammatory, fibrotic interstitial opacity, bronchiectasis and volume loss in the left upper lobe and lingula is unchanged.  Dependent bibasilar irregular interstitial opacity and septal thickening unchanged findings are most likely sequela of prior infection versus inflammation.  Small scattered nodules measuring 0.5 cm.  In the right middle lobe.  High-resolution CT chest November 17, 2018 shows patchy areas of groundglass attenuation, septal thickening most evident in the left upper lobe, associated mild bronchiectasis in the left upper lobe.  Minimal progression findings compared with previous exam bilateral apical left greater than right nodular areas of pleural-parenchymal thickening and architectural distortion, 4 mm right middle lobe nodule  2D echo September 10, 2021 shows EF at 50 to 39%, grade 1 diastolic dysfunction, right ventricular systolic function is normal.  RV size is normal.   [place] Integrated Comprehensive ILD Questionnaire  Symptoms:  Chronic cough for years, dyspnea, DOE , progressive decline, decreased activity tolerance   SYMPTOM SCALE - ILD 11/27/2021  Current weight 172 lbs   O2 use No   Shortness of Breath 0 -> 5 scale with 5 being worst (score 6 If unable to do)  At rest 4  Simple tasks - showers, clothes change, eating, shaving 4  Household (dishes, doing bed, laundry) 5  Shopping 5  Walking level at  own pace 5  Walking up Stairs 5  Total (30-36) Dyspnea Score 28      Non-dyspnea symptoms (0-> 5 scale) 11/27/2021  How bad is your cough? 4  How bad is your fatigue 5  How bad is nausea 4  How bad is vomiting?  0  How bad is diarrhea? 4  How bad is anxiety? 4  How bad is depression 4  Any chronic pain - if so where and how bad 4     Past Medical History :  Past Medical History:  Diagnosis Date   Acute right eye pain 03/25/2021   Allergy status to unspecified drugs, medicaments and biological substances 02/23/2013   Asthma, mild persistent 03/02/2013   B12 deficiency 08/24/2017   Chronic anxiety 04/08/2020   Formatting of this note might be different from the original. sees dr. love   Chronic depression 04/08/2020   Formatting of this note might be different from the original. sees dr. love   Cognitive disorder 10/22/2012   Colon polyps    COPD (chronic obstructive pulmonary disease) ([place])    Dizziness 10/22/2012   DVT (deep venous thrombosis) ([place])    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   Formatting of this note might be different from the original. Added automatically from request for surgery [id number]   Essential hypertension 10/22/2012   Gastroesophageal reflux disease without esophagitis 10/22/2012   Generalized anxiety disorder 11/15/2012   Heart attack ([place])    History of colon polyps 01/13/2017   History of hysterectomy 02/09/2017   History of recurrent UTIs 11/19/2016   History of shingles 04/08/2020   Hyperlipidemia    Idiopathic peripheral neuropathy 01/30/2020   Interstitial cystitis (chronic)  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 05/16/2018   Formatting of this note might be different from the original. sees dr. Hazle Nordmann   Interstitial lung disease [facility])    Irregular heart rhythm 04/08/2020   Formatting of this note might be different from the original. seen by dr. 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 Dry eyes   HEENT:   No headaches,  Difficulty swallowing,  Tooth/dental problems, or  Sore throat,                No sneezing, itching, ear ache, nasal congestion, post nasal drip,   CV:  No chest pain,  Orthopnea, PND, swelling in lower extremities, anasarca, dizziness, palpitations, syncope.   GI  +heartburn, indigestion, No abdominal pain,  vomiting, diarrhea, change in bowel habits, loss of appetite, bloody stools. + nausea   Resp: .  No chest wall deformity  Skin: no rash or lesions.  GU: no dysuria, change in color of urine, no urgency or frequency.  No flank pain, no hematuria   MS:  Joint stiffness, No decreased range of motion.  No back pain.  Psych:  No change in mood or affect. No depression or anxiety.  No memory loss.      FAMILY HISTORY of LUNG DISEASE:  Positive for TB in father  Hx of Rheumatoid Arthritis -Mother, Father and brother  Brother with pulmonary fibrosis  FH of asthma and COPD   Neg to Dyskeratosis congenita, hermansky pudlak syndrome   PERSONAL EXPOSURE HISTORY:  Former smoker , quit 40  ago. , second hand smoke exposure , no vaping , no drug use .   HOME  EXPOSURE and HOBBY DETAILS :  Single family home, lived in for 79yr no basement , no humidifier, no CPAP .  Has neb machine . No hottub, no birds, or pets . No known mold  No instruments or gardening.  No feather pillows.  No known water damage.  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  Otherwise medication history was unrevealing.  INVESTIGATIONS: CT chest November 04, 2021 shows new scattered groundglass opacities throughout the left lung nonspecific.  Underlying fibrotic interstitial opacity, bronchiectasis and volume loss of the medial left upper lobe and lingula unchanged.  Findings are most likely sequela of  previous infection versus inflammation.  Care everywhere reports   CT chest 2014 5 mm right upper lobe nodule.  Groundglass opacities in the left lower lobe resolved persistent groundglass opacities and bronchiectasis in the lingula felt to be postinfectious/postinflammatory  CT chest 2015 stable bronchiectasis and groundglass density in the lingula  PFTs November 08, 2015 FEV1 79%, ratio 82, FVC 73%  CT chest December 04, 2015 minimum change in CT chest with fibrotic interstitial lung disease.  Most advanced in the left upper lobe with a fairly extensive architectural distortion with scarring and bronchiectasis.  CT chest from Sep 21, 2016 scarring and bronchiectasis in the left upper lobe  High-resolution CT chest November 17, 2018 shows patchy areas of groundglass attenuation and septal thickening most evident in the left upper lobe associated bronchiectasis in the left upper lobe.  No honeycombing.  Minimal progression  of findings compared to previous exam-mild fibrosis asymmetrical distributed in the lungs most evident in the left upper lobe.  Favored to be sequela of postinfectious versus inflammatory unchanged 4 mm right middle lobe nodule.   2D echo September 10, 2021 EF at 50 to 50%, grade 1 diastolic dysfunction, right ventricular size is normal.  Right ventricular systolic function is normal  Aspergillus panel 2019 negative, hypersensitivity panel negative, QuantiFERON gold panel negative Sputum culture 2019 negative   11/27/2021 Pulmonary Consult : Interstitial lung disease, bronchiectasis, asthma Patient presents for a pulmonary consult today to establish for interstitial lung disease, bronchiectasis and asthma.  She was kindly referred by her primary care provider Samuel Bouche, NP patient says she has been followed at [place] [facility] for several years.  She is currently not happy with their service and wants to get a second opinion and reestablish with our practice.  She has a long history of recurrent pneumonias, interstitial lung disease and bronchiectasis.  She has a significant family history of pulmonary fibrosis in her brother.  Her father had tuberculosis.  Patient smoked about a half a pack of cigarettes weekly for about 25 years.  From age 5 to age 83.  She carries a diagnosis of asthma.  She has vest therapy at home that she uses on occasion if her cough increases.  She does not use her flutter valve on a regular basis.  Has never been on oxygen therapy.  She says she was recently treated with antibiotics for pneumonia.  She has an ongoing cough that is minimally productive.  Feels it is very hard to get up mucus at times.  Over the last few months feels that she is getting slowly worse.  Has low energy decreased stamina and increased shortness of breath with activity. Patient says she has significant reflux despite being on daily medications.  She denies any dysphagia.  She has a  history of breast cancer status post bilateral mastectomy.  She did take chemo for 6 months.  In 2003.  She has no known recurrence. She complains of joint stiffness and pain.  Has significantly dry eyes.  She is not on any maintenance inhaler.  Has an albuterol inhaler and nebulizer that she uses on occasion. Says that she was seen years and years ago by rheumatology and told that she did not have any autoimmune disorder.  Social history patient lives at home alone.  She uses a walker.  She does not drive. She has a caregiver that comes 4 days a week for about 4 hours each day.  She has family that is close  by.  Occasionally drinks a glass of wine.  No previous drug use.  Former smoker.    Allergies  Allergen Reactions   Diltiazem Palpitations   Iodinated Contrast Media Anaphylaxis    Shock and seizures   Iodine Anaphylaxis    Contrast Media   Iodine-131 Anaphylaxis   Levalbuterol Hcl Anaphylaxis and Shortness Of Breath   Meperidine Hcl Anaphylaxis   Mometasone Anaphylaxis   Quinolones Itching and Rash   Tape Hives   Tiotropium Shortness Of Breath   Umeclidinium Palpitations   Asmanex (120 Metered Doses) [Mometasone Furoate]    Azithromycin Diarrhea and Nausea And Vomiting   Elemental Sulfur Rash    blisters   Statins     Patient states she had tried two or three and she states it causes her pain and she declines to take statin.    Sulfa Antibiotics Rash and Swelling    blisters    Sulfasalazine Rash    blisters   Valacyclovir Nausea And Vomiting    GI Upset   Celebrex [Celecoxib] Nausea And Vomiting   Codeine Nausea Only   Diltiazem Hcl Palpitations    Heart speeds up    Fluticasone Furoate-Vilanterol Nausea Only    Doesn't feel well.     Latex Rash   Lisinopril Rash   Pantoprazole Rash    Immunization History  Administered Date(s) Administered   Fluad Quad(high Dose 65+) 03/24/2021   Influenza, High Dose Seasonal PF 03/26/2005, 03/01/2007, 03/07/2008,  03/13/2009, 01/28/2010, 03/23/2012, 03/08/2013, 03/11/2015, 03/04/2017, 02/24/2018, 03/18/2019   Influenza,inj,Quad PF,6-35 Mos 01/30/2020   Influenza-Unspecified 03/22/2015   Pneumococcal Conjugate-13 06/28/2012, 03/22/2015, 01/07/2017   Pneumococcal Polysaccharide-23 06/28/2012   Pneumococcal-Unspecified 03/22/2015   Tdap 08/31/2013    Past Medical History:  Diagnosis Date   Acute right eye pain 03/25/2021   Allergy status to unspecified drugs, medicaments and biological substances 02/23/2013   Asthma, mild persistent 03/02/2013   B12 deficiency 08/24/2017   Chronic anxiety 04/08/2020   Formatting of this note might be different from the original. sees dr. love   Chronic depression 04/08/2020   Formatting of this note might be different from the original. sees dr. 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 Added automatically from request for surgery [id number]   Essential hypertension 10/22/2012   Gastroesophageal reflux disease without esophagitis 10/22/2012   Generalized anxiety disorder 11/15/2012   Heart attack ([place])    History of colon polyps 01/13/2017   History of hysterectomy 02/09/2017   History of recurrent UTIs 11/19/2016   History of shingles 04/08/2020   Hyperlipidemia    Idiopathic peripheral neuropathy 01/30/2020   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 05/16/2018   Formatting of this note might be different from the original. sees dr. Hazle Nordmann   Interstitial lung disease ([place])    Irregular heart rhythm 04/08/2020   Formatting of this note might be different from the original. seen by dr. 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   Nuclear sclerotic cataract of left eye 06/25/2016   Osteoarthrosis 10/22/2012   Other nonspecific abnormal finding of lung field 03/02/2013   Personal history of malignant neoplasm of breast 09/23/2011   Prediabetes 04/08/2020   Recurrent falls 01/07/2017   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   Right-sided headache 04/02/2021   Statin intolerance 07/21/2017   Transient ischemic attack (TIA) 03/04/2017   Trigeminal neuralgia of right side of face 03/25/2021    Tobacco History: Social History   Tobacco Use  Smoking Status Former   Packs/day: 0.50   Types: Cigarettes   Quit date: 04/08/1990   Years since quitting: 31.6  Smokeless Tobacco Never   Counseling given: Not Answered   Outpatient Medications Prior to Visit  Medication Sig Dispense Refill   albuterol (PROVENTIL) (2.5 MG/3ML) 0.083% nebulizer solution Take 3 mLs (2.5 mg total) by nebulization every 4 (four) hours as needed for wheezing or shortness of breath (please include nebulizer machine, hoses, and mask if needed.). 30 mL 2   albuterol (VENTOLIN HFA) 108 (90 Base) MCG/ACT inhaler Inhale 2 puffs into the lungs every 4 (four) hours as needed for wheezing or shortness of breath. 18 g 5   amLODipine (NORVASC) 2.5 MG tablet Take 1 tablet (2.5 mg total) by mouth daily. DUE FOR FOLLOW UP APPT 90 tablet 0   docusate sodium (COLACE) 100 MG capsule Take 100 mg by mouth daily as needed for mild constipation.     ibuprofen (ADVIL) 200 MG tablet Take 200 mg by mouth every 6 (six) hours as needed. 1-2 tabs prn     lidocaine (LIDODERM) 5 % Place 1 patch onto the skin every 12 (twelve) hours. Remove & Discard patch within 12 hours or as directed by MD 30 patch 0   LORazepam (ATIVAN) 1 MG  tablet Take 1 tablet (1 mg total) by mouth 2 (two) times daily as needed (Use very sparingly). 30 tablet 0   meclizine (ANTIVERT) 25 MG tablet Take 1 tablet (25 mg total) by mouth 3 (three) times daily as needed for dizziness or nausea. 30 tablet 3   Multiple Vitamin (MULTI-VITAMIN) tablet Take 1 tablet by mouth daily.     omeprazole (PRILOSEC) 40 MG capsule TAKE 1 CAPSULE(40 MG) BY MOUTH DAILY 90 capsule 1   ondansetron (ZOFRAN-ODT) 8 MG disintegrating tablet Take 1 tablet (8 mg total) by mouth every 8 (eight) hours as needed for nausea. 20 tablet 1   sertraline (ZOLOFT) 50 MG tablet Take '50mg'$  (1 tablet) every morning and '25mg'$  (1/2 tablet) every evening for 1 week then increase to '50mg'$  twice daily. 180 tablet 1   traMADol (ULTRAM) 50 MG tablet Take 1 tablet (50 mg total) by mouth every 6 (six) hours as needed. 30 tablet 0   acetaminophen (TYLENOL) 325 MG tablet Take 650 mg by mouth every 6 (six) hours as needed for pain or headache. (Patient not taking: Reported on 11/27/2021)     valACYclovir (VALTREX) 1000 MG tablet Take 1 tablet (1,000 mg total) by mouth 3 (three) times daily. For 7 days. (Patient not taking: Reported on 11/27/2021) 21 tablet 0   celecoxib (CELEBREX) 100 MG capsule Take 1 capsule (100 mg total) by mouth 2 (two) times daily. (Patient not taking: Reported on 11/27/2021) 60 capsule 2   gabapentin (NEURONTIN) 100 MG capsule Take 1 capsule (100 mg total) by mouth 3 (three) times daily. (Patient not taking: Reported on 11/27/2021) 90 capsule 3   No facility-administered medications prior  to visit.       Physical Exam  BP 106/70 (BP Location: Left Arm, Cuff Size: Large)   Pulse 80   SpO2 94%   GEN: A/Ox3; pleasant , NAD, elderly    HEENT:  [place]/AT,  EACs-clear, TMs-wnl, NOSE-clear, THROAT-clear, no lesions, no postnasal drip or exudate noted.   NECK:  Supple w/ fair ROM; no JVD; normal carotid impulses w/o bruits; no thyromegaly or nodules palpated; no lymphadenopathy.    RESP  scattered rhonchi,  no accessory muscle use, no dullness to percussion  CARD:  RRR, no m/r/g, no peripheral edema, pulses intact, no cyanosis or clubbing.  GI:   Soft & nt; nml bowel sounds; no organomegaly or masses detected.   Musco: Warm bil, no deformities or joint swelling noted.   Neuro: alert, no focal deficits noted.    Skin: Warm, no lesions or rashes    Lab Results:  CBC    Component Value Date/Time   WBC 7.3 10/31/2021 0000   RBC 4.64 10/31/2021 0000   HGB 13.9 10/31/2021 0000   HGB 13.8 04/04/2021 1025   HCT 41.4 10/31/2021 0000   PLT 259 10/31/2021 0000   PLT 225 04/04/2021 1025   MCV 89.2 10/31/2021 0000   MCH 30.0 10/31/2021 0000   MCHC 33.6 10/31/2021 0000   RDW 13.0 10/31/2021 0000   LYMPHSABS 2,686 10/31/2021 0000   MONOABS 0.4 04/04/2021 1025   EOSABS 241 10/31/2021 0000   BASOSABS 51 10/31/2021 0000    BMET    Component Value Date/Time   NA 140 10/31/2021 0000   K 4.3 10/31/2021 0000   CL 106 10/31/2021 0000   CO2 22 10/31/2021 0000   GLUCOSE 114 (H) 10/31/2021 0000   BUN 12 10/31/2021 0000   CREATININE 0.87 10/31/2021 0000   CALCIUM 9.3 10/31/2021 0000   GFRNONAA >60 09/16/2021 1120   GFRNONAA 58 (L) 04/04/2021 1025   GFRNONAA 56 (L) 07/03/2020 1553   GFRAA 65 07/03/2020 1553    BNP    Component Value Date/Time   BNP 7 07/03/2020 1553    ProBNP No results found for: "PROBNP"  Imaging: CT Chest Wo Contrast  Result Date: 11/05/2021 CLINICAL DATA:  Interstitial lung disease EXAM: CT CHEST WITHOUT CONTRAST TECHNIQUE: Multidetector CT imaging of the chest was performed following the standard protocol without IV contrast. RADIATION DOSE REDUCTION: This exam was performed according to the departmental dose-optimization program which includes automated exposure control, adjustment of the mA and/or kV according to patient size and/or use of iterative reconstruction technique. COMPARISON:  04/09/2021 FINDINGS: Cardiovascular: No significant  vascular findings. Normal heart size. Left coronary artery calcifications. No pericardial effusion. Mediastinum/Nodes: No enlarged mediastinal, hilar, or axillary lymph nodes. Small hiatal hernia. Thyroid gland, trachea, and esophagus demonstrate no significant findings. Lungs/Pleura: New, scattered ground-glass airspace opacity throughout the left lung (series 3, image 33, 57). Underlying fibrotic interstitial opacity, bronchiectasis, and volume loss of the medial left upper lobe and lingula is unchanged (series 3, image 64). Minimal dependent bibasilar irregular interstitial opacity and septal thickening, unchanged compared to prior. Occasional small, definitively benign nodules, for example a 0.5 cm nodule of the lateral segment right middle lobe (series 3, image 77). No pleural effusion or pneumothorax. Upper Abdomen: No acute abnormality. Musculoskeletal: Status post bilateral mastectomy. No suspicious osseous lesions identified. IMPRESSION: 1. New, scattered ground-glass airspace opacity throughout the left lung, nonspecific and infectious or inflammatory. 2. Underlying fibrotic interstitial opacity, bronchiectasis, and volume loss of the medial left upper lobe and  lingula is unchanged. Minimal dependent bibasilar irregular interstitial opacity and septal thickening, unchanged compared to prior. Findings are most likely sequelae of prior infection or inflammation, mild fibrotic interstitial lung disease with an unusual distribution not however strictly excluded. 3. Status post bilateral mastectomy. 4. Coronary artery disease. Electronically Signed   By: Delanna Ahmadi M.D.   On: 11/05/2021 13:34   DG Chest 2 View  Result Date: 10/31/2021 CLINICAL DATA:  Pneumonia EXAM: CHEST - 2 VIEW COMPARISON:  Radiograph dated October 16, 2021; chest x-ray dated March 28, 2021 FINDINGS: Cardiac and mediastinal contours within normal limits. Persistent reticulonodular opacities of the left upper lobe which are overall  increased when compared with more remote prior radiographs. No pleural effusion or pneumothorax. IMPRESSION: Persistent opacities are seen in the left upper lobe which are unchanged when compared with recent prior, but overall increased when compared with more remote prior radiograph. Recommend chest CT for further characterization. Electronically Signed   By: Yetta Glassman M.D.   On: 10/31/2021 12:13    cyanocobalamin ((VITAMIN B-12)) injection 1,000 mcg     Date Action Dose Route User   11/14/2021 1041 Given 1,000 mcg Intramuscular (Right Deltoid) Margarette Asal L, CMA           No data to display          No results found for: "NITRICOXIDE"      Assessment & Plan:   No problem-specific Assessment & Plan notes found for this encounter.     Rexene Edison, NP 11/27/2021

## 2021-11-27 NOTE — Patient Instructions (Addendum)
Labs today.  Continue on Prilosec '20mg'$  Twice daily .  GERD diet.  Mucinex or Robitussin liquid Twice daily  .  Restart VEST Twice daily  .  Start Flutter valve Twice daily   Albuterol inhaler or neb As needed   Set up PFTs (before next visit )  Follow up with Dr. Chase Caller next month as planned

## 2021-11-27 NOTE — Progress Notes (Signed)
Patient seen in the office today and instructed on use of flutter.  Patient expressed understanding and demonstrated technique.

## 2021-11-28 ENCOUNTER — Telehealth: Payer: Self-pay | Admitting: Adult Health

## 2021-11-28 NOTE — Telephone Encounter (Signed)
Heather,  Do you happen to know what papers this patient is talking about. He states he brought them in with him yesterday  Please advise

## 2021-11-30 LAB — ANTI-NUCLEAR AB-TITER (ANA TITER)
ANA TITER: 1:40 {titer} — ABNORMAL HIGH
ANA Titer 1: 1:40 {titer} — ABNORMAL HIGH

## 2021-11-30 LAB — ANTI-DNA ANTIBODY, DOUBLE-STRANDED: ds DNA Ab: 1 IU/mL

## 2021-11-30 LAB — QUANTIFERON-TB GOLD PLUS
Mitogen-NIL: >10 IU/mL
NIL: 0.08 IU/mL
QuantiFERON-TB Gold Plus: NEGATIVE
TB1-NIL: 0.02 IU/mL
TB2-NIL: 0.02 IU/mL

## 2021-11-30 LAB — IGG, IGA, IGM
IgG (Immunoglobin G), Serum: 894 mg/dL (ref 600–1540)
IgM, Serum: 143 mg/dL (ref 50–300)
Immunoglobulin A: 247 mg/dL (ref 70–320)

## 2021-11-30 LAB — SJOGRENS SYNDROME-B EXTRACTABLE NUCLEAR ANTIBODY: SSB (La) (ENA) Antibody, IgG: <1 AI

## 2021-11-30 LAB — CYCLIC CITRUL PEPTIDE ANTIBODY, IGG: Cyclic Citrullin Peptide Ab: <16 UNITS

## 2021-11-30 LAB — SJOGRENS SYNDROME-A EXTRACTABLE NUCLEAR ANTIBODY: SSA (Ro) (ENA) Antibody, IgG: <1 AI

## 2021-11-30 LAB — RHEUMATOID FACTOR: Rheumatoid fact SerPl-aCnc: <14 IU/mL (ref <14)

## 2021-11-30 LAB — ANA: Anti Nuclear Antibody (ANA): POSITIVE — AB

## 2021-12-01 NOTE — Telephone Encounter (Signed)
Called and spoke with patient, she states her daughter found the papers she was asking about the day after her appointment with [place].  Nothing further needed.

## 2021-12-02 NOTE — Assessment & Plan Note (Signed)
Reported mild asthma.  Check PFTs on return.  Albuterol as needed.  Asthma action plan discussed control for triggers  Plan  Patient Instructions  Labs today.  Continue on Prilosec '20mg'$  Twice daily .  GERD diet.  Mucinex or Robitussin liquid Twice daily  .  Restart VEST Twice daily  .  Start Flutter valve Twice daily   Albuterol inhaler or neb As needed   Set up PFTs (before next visit )  Follow up with Dr. Chase Caller next month as planned

## 2021-12-02 NOTE — Assessment & Plan Note (Signed)
>>  ASSESSMENT AND PLAN FOR ASTHMA, MILD PERSISTENT WRITTEN ON 12/02/2021  2:08 PM BY PARRETT, TAMMY S, NP  Reported mild asthma.  Check PFTs on return.  Albuterol as needed.  Asthma action plan discussed control for triggers  Plan  Patient Instructions  Labs today.  Continue on Prilosec 20mg  Twice daily .  GERD diet.  Mucinex or Robitussin liquid Twice daily  .  Restart VEST Twice daily  .  Start Flutter valve Twice daily   Albuterol inhaler or neb As needed   Set up PFTs (before next visit )  Follow up with Dr. Marchelle Gearing next month as planned

## 2021-12-02 NOTE — Assessment & Plan Note (Signed)
Longstanding interstitial lung disease changes on CT scan with a symmetrical distribution with left-sided scarring bronchiectasis and architectural distortion. Need to increase mucociliary clearance.  Would restart vest therapy and flutter valve. Improved GERD control  Autoimmune and connective tissue serology pending. Patient questionnaire positive for family history of autoimmune, pulmonary fibrosis Exposures are home nebulizer.  Previous smoking.  Worked as a Insurance claims handler briefly.  And previous history of breast cancer with chemotherapy use possible Cytoxan  Patient will follow back in the office after pulmonary function testing.  We will review all test results in detail with Dr. Chase Caller  Plan  Patient Instructions  Labs today.  Continue on Prilosec '20mg'$  Twice daily .  GERD diet.  Mucinex or Robitussin liquid Twice daily  .  Restart VEST Twice daily  .  Start Flutter valve Twice daily   Albuterol inhaler or neb As needed   Set up PFTs (before next visit )  Follow up with Dr. Chase Caller next month as planned

## 2021-12-29 ENCOUNTER — Ambulatory Visit (INDEPENDENT_AMBULATORY_CARE_PROVIDER_SITE_OTHER): Payer: Medicare Other | Admitting: Internal Medicine

## 2021-12-29 DIAGNOSIS — J479 Bronchiectasis, uncomplicated: Secondary | ICD-10-CM

## 2021-12-29 DIAGNOSIS — J849 Interstitial pulmonary disease, unspecified: Secondary | ICD-10-CM

## 2021-12-29 LAB — PULMONARY FUNCTION TEST
DL/VA % pred: 88 %
DL/VA: 3.51 ml/min/mmHg/L
DLCO cor % pred: 56 %
DLCO cor: 11.74 ml/min/mmHg
DLCO unc % pred: 56 %
DLCO unc: 11.7 ml/min/mmHg
FEF 25-75 Post: 1.47 L/sec
FEF 25-75 Pre: 1.83 L/sec
FEF2575-%Change-Post: -19 %
FEF2575-%Pred-Post: 92 %
FEF2575-%Pred-Pre: 115 %
FEV1-%Change-Post: -2 %
FEV1-%Pred-Post: 66 %
FEV1-%Pred-Pre: 68 %
FEV1-Post: 1.48 L
FEV1-Pre: 1.52 L
FEV1FVC-%Change-Post: 2 %
FEV1FVC-%Pred-Pre: 110 %
FEV6-%Change-Post: -4 %
FEV6-%Pred-Post: 62 %
FEV6-%Pred-Pre: 65 %
FEV6-Post: 1.77 L
FEV6-Pre: 1.86 L
FEV6FVC-%Pred-Post: 105 %
FEV6FVC-%Pred-Pre: 105 %
FVC-%Change-Post: -4 %
FVC-%Pred-Post: 59 %
FVC-%Pred-Pre: 62 %
FVC-Post: 1.77 L
FVC-Pre: 1.86 L
Post FEV1/FVC ratio: 84 %
Post FEV6/FVC ratio: 100 %
Pre FEV1/FVC ratio: 82 %
Pre FEV6/FVC Ratio: 100 %
RV % pred: 73 %
RV: 1.86 L
TLC % pred: 66 %
TLC: 3.68 L

## 2021-12-29 NOTE — Progress Notes (Signed)
PFT done today. 

## 2022-01-01 ENCOUNTER — Encounter: Payer: Self-pay | Admitting: Internal Medicine

## 2022-01-01 ENCOUNTER — Ambulatory Visit: Payer: Medicare Other | Admitting: Internal Medicine

## 2022-01-01 VITALS — BP 114/68 | HR 72 | Temp 98.0°F | Ht 67.0 in | Wt 174.0 lb

## 2022-01-01 DIAGNOSIS — J849 Interstitial pulmonary disease, unspecified: Secondary | ICD-10-CM | POA: Diagnosis not present

## 2022-01-01 DIAGNOSIS — I5189 Other ill-defined heart diseases: Secondary | ICD-10-CM

## 2022-01-01 DIAGNOSIS — R5381 Other malaise: Secondary | ICD-10-CM

## 2022-01-01 DIAGNOSIS — R0609 Other forms of dyspnea: Secondary | ICD-10-CM

## 2022-01-01 NOTE — Patient Instructions (Addendum)
ICD-10-CM   1. DOE (dyspnea on exertion)  R06.09     2. ILD (interstitial lung disease) ([place])  J84.9     3. Diastolic dysfunction  J17.91     4. Physical deconditioning  R53.81      The shortness of breath is multifactorial and is because of your scar tissue in the lung [AKA pulmonary fibrosis interstitial lung disease] mild heart muscle stiffness [diastolic dysfunction] and general body deconditioning  Overall have a very difficult situation to treat  Plan  - For pulmonary fibrosis/interstitial lung disease: Recommend serial monitoring.  Antifibrotic's have too many side effects and with her allergy profile it is not a good option for you  -For physical deconditioning: Ideally recommend pulm rehabilitation but with the joint issues this is not possible  -For heart muscle stiffness: Please talk to your cardiologist  -General: Recommend walking desaturation test in the office today and overnight pulse oximetry.  If these are abnormal oxygen therapy can help you  Follow-up  - Do spirometry and DLCO in 3 months - 4 months -Return to see Dr. Chase Caller in 3 months  - 4 months;  30-minute visit

## 2022-01-01 NOTE — Progress Notes (Addendum)
$'@Patient'F$  ID: Courtney Cummings, female    DOB: 09-15-1940, 81 y.o.   MRN: [id number]  Chief Complaint  Patient presents with   Consult    Referring provider: Samuel Bouche, NP  HPI: 81 year old female former smoker seen for pulmonary consult November 27, 2021 to establish for interstitial lung disease, bronchiectasis and asthma Medical history significant for hypertension, coronary artery disease, interstitial cystitis, asthma   11/27/2021 Pulmonary Consult : Interstitial lung disease, bronchiectasis, asthma Patient presents for a pulmonary consult today to establish for interstitial lung disease, bronchiectasis and asthma.  She was kindly referred by her primary care provider Samuel Bouche, NP patient says she has been followed at [facility] pulmonary for several years.  She is currently not happy with their service and wants to get a second opinion and reestablish with our practice.  She has a long history of recurrent pneumonias, interstitial lung disease and bronchiectasis.  She has a significant family history of pulmonary fibrosis in her brother.  Her father had tuberculosis.  Patient smoked about a half a pack of cigarettes weekly for about 25 years.  From age 59 to age 75.  She carries a diagnosis of asthma.  She has vest therapy at home that she uses on occasion if her cough increases.  She does not use her flutter valve on a regular basis.  Has never been on oxygen therapy.  She says she was recently treated with antibiotics for pneumonia.  She has an ongoing cough that is minimally productive.  Feels it is very hard to get up mucus at times.  Over the last few months feels that she is getting slowly worse.  Has low energy decreased stamina and increased shortness of breath with activity. Patient says she has significant reflux despite being on daily medications.  She denies any dysphagia.  She has a history of breast cancer status post bilateral mastectomy.  She did take chemo  for 6 months.  In 2003.  She has no known recurrence.   She complains of joint stiffness and pain.  Has significantly dry eyes.  She is not on any maintenance inhaler.  Has an albuterol inhaler and nebulizer that she uses on occasion. Says that she was seen years and years ago by rheumatology and told that she did not have any autoimmune disorder.  Social history patient lives at home alone.  She uses a walker.  She does not drive. She has a caregiver that comes 4 days a week for about 4 hours each day.  She has family that is close by.  Occasionally drinks a glass of wine.  No previous drug use.  Former smoker.    [facility] Comprehensive ILD Questionnaire  Symptoms:  Chronic cough for years, dyspnea, DOE , progressive decline, decreased activity tolerance   SYMPTOM SCALE - ILD 11/27/2021  Current weight 172 lbs   O2 use No   Shortness of Breath 0 -> 5 scale with 5 being worst (score 6 If unable to do)  At rest 4  Simple tasks - showers, clothes change, eating, shaving 4  Household (dishes, doing bed, laundry) 5  Shopping 5  Walking level at own pace 5  Walking up Stairs 5  Total (30-36) Dyspnea Score 28      Non-dyspnea symptoms (0-> 5 scale) 11/27/2021  How bad is your cough? 4  How bad is your fatigue 5  How bad is nausea 4  How bad is vomiting?  0  How bad is diarrhea? 4  How bad is anxiety? 4  How bad is depression 4  Any chronic pain - if so where and how bad 4     FAMILY HISTORY of LUNG DISEASE:  Positive for TB in father  Hx of Rheumatoid Arthritis -Mother, Father and brother  Brother with pulmonary fibrosis  FH of asthma and COPD   Neg to Dyskeratosis congenita, hermansky pudlak syndrome   PERSONAL EXPOSURE HISTORY:  Former smoker , quit 40 ago. , second hand smoke exposure , no vaping , no drug use .   HOME  EXPOSURE and HOBBY DETAILS :  Single family home, lived in for 47yr no basement , no humidifier, no CPAP .  Has neb machine . No  hottub, no birds, or pets . No known mold  No instruments or gardening.  No feather pillows.  No known water damage.  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  Otherwise medication history was unrevealing.  INVESTIGATIONS: CT chest November 04, 2021 shows new scattered groundglass opacities throughout the left lung nonspecific.  Underlying fibrotic interstitial opacity, bronchiectasis and volume loss of the medial left upper lobe and lingula unchanged.  Findings are most likely sequela of previous infection versus inflammation.  Care everywhere reports   CT chest 2014 5 mm right upper lobe nodule.  Groundglass opacities in the left lower lobe resolved persistent groundglass opacities and bronchiectasis in the lingula felt to be postinfectious/postinflammatory  CT chest 2015 stable bronchiectasis and groundglass density in the lingula  PFTs November 08, 2015 FEV1 79%, ratio 82, FVC 73%  CT chest December 04, 2015 minimum change in CT chest with fibrotic interstitial lung disease.  Most advanced in the left upper lobe with a fairly extensive architectural distortion with scarring and bronchiectasis.  CT chest from Sep 21, 2016 scarring and bronchiectasis in the left upper lobe  High-resolution CT chest November 17, 2018 shows patchy areas of groundglass attenuation and septal thickening most evident in the left upper lobe associated bronchiectasis in the left upper lobe.  No honeycombing.  Minimal progression of findings compared to previous exam-mild fibrosis asymmetrical distributed in the lungs most evident in the left upper lobe.  Favored to be sequela of postinfectious versus inflammatory unchanged 4 mm  right middle lobe nodule.   2D echo September 10, 2021 EF at 50 to 538% grade 1 diastolic dysfunction, right ventricular size is normal.  Right ventricular systolic function is normal  Aspergillus panel 2019 negative, hypersensitivity panel negative, QuantiFERON gold panel negative Sputum culture 2019 negative   TEST/EVENTS :  CT chest November 04, 2021 shows new scattered groundglass airspace opacities throughout the left lung nonspecific..  Infectious versus inflammatory, fibrotic interstitial opacity, bronchiectasis and volume loss in the left upper lobe and lingula is unchanged.  Dependent bibasilar irregular interstitial opacity and septal thickening unchanged findings are most likely sequela of prior infection versus inflammation.  Small scattered nodules measuring 0.5 cm.  In the right middle lobe.  High-resolution CT chest November 17, 2018 shows patchy areas of groundglass attenuation, septal thickening most evident in the left upper lobe, associated mild bronchiectasis in the left upper lobe.  Minimal progression findings compared with previous exam bilateral  apical left greater than right nodular areas of pleural-parenchymal thickening and architectural distortion, 4 mm right middle lobe nodule  2D echo September 10, 2021 shows EF at 50 to 24%, grade 1 diastolic dysfunction, right ventricular systolic function is normal.  RV size is normal.   OV 01/01/2022  Subjective:  Patient ID: Courtney Cummings, female , DOB: 05-27-1940 , age 38 y.o. , MRN: [id number] , ADDRESS: [address] [place] [address] PCP Samuel Bouche, NP Patient Care Team: Samuel Bouche, NP as PCP - General (Nurse Practitioner) Volanda Napoleon, MD as Medical Oncologist (Oncology)  This Provider for this visit: Treatment Team:  Attending Provider: Brand Males, MD    01/01/2022 -   Chief Complaint  Patient presents with   Consult    Pt states she has complaints of cough, SOB, and chest discomfort. States she can become  SOB even if sitting at rest. States the cough can happen at any time.     HPI Courtney Cummings 81 y.o. -follow-up after interstitial lung disease work-up.  She is seen [facility]  She is here with her daughter Arby Barrette.  She lives in [place].  She has transferred her care out of [facility].  She has significant amount of shortness of breath and fatigue.  She says she has multiple drug allergies.  She did get Cytoxan many years ago.  She is not sure on the basis of her ILD.  I visualized the scan and it has alternative pattern she does have fibrotic changes in the lung base but also in the lingula mostly.  Autoimmune serology profile is negative.  She is very deconditioned and she says she cannot do rehabilitation.  She has diastolic dysfunction but she rejected medications because she is generally wary about multiple drug allergies.      PFT     Latest Ref Rng & Units 12/29/2021    8:43 AM  PFT Results  FVC-Pre L 1.86  P  FVC-Predicted Pre % 62  P  FVC-Post L 1.77  P  FVC-Predicted Post % 59  P  Pre FEV1/FVC % % 82  P  Post FEV1/FCV % % 84  P  FEV1-Pre L 1.52  P  FEV1-Predicted Pre % 68  P  FEV1-Post L 1.48  P  DLCO uncorrected ml/min/mmHg 11.70  P  DLCO UNC% % 56  P  DLCO corrected ml/min/mmHg 11.74  P  DLCO COR %Predicted % 56  P  DLVA Predicted % 88  P  TLC L 3.68  P  TLC % Predicted % 66  P  RV % Predicted % 73  P    P Preliminary result    CTchest June 2023  Narrative & Impression  CLINICAL DATA:  Interstitial lung disease   EXAM: CT CHEST WITHOUT CONTRAST   TECHNIQUE: Multidetector CT imaging of the chest was performed following the standard protocol without IV contrast.   RADIATION DOSE REDUCTION: This exam was performed according to the departmental dose-optimization program which includes automated exposure control, adjustment of the mA and/or kV according to patient size and/or use of iterative reconstruction technique.   COMPARISON:  04/09/2021    FINDINGS: Cardiovascular: No significant vascular findings. Normal heart size. Left coronary artery calcifications. No pericardial effusion.   Mediastinum/Nodes: No enlarged mediastinal, hilar, or axillary lymph nodes. Small hiatal hernia. Thyroid gland, trachea, and esophagus demonstrate no significant findings.   Lungs/Pleura: New, scattered ground-glass airspace opacity throughout the left lung (series 3, image 33, 57). Underlying  fibrotic interstitial opacity, bronchiectasis, and volume loss of the medial left upper lobe and lingula is unchanged (series 3, image 64). Minimal dependent bibasilar irregular interstitial opacity and septal thickening, unchanged compared to prior. Occasional small, definitively benign nodules, for example a 0.5 cm nodule of the lateral segment right middle lobe (series 3, image 77). No pleural effusion or pneumothorax.   Upper Abdomen: No acute abnormality.   Musculoskeletal: Status post bilateral mastectomy. No suspicious osseous lesions identified.   IMPRESSION: 1. New, scattered ground-glass airspace opacity throughout the left lung, nonspecific and infectious or inflammatory. 2. Underlying fibrotic interstitial opacity, bronchiectasis, and volume loss of the medial left upper lobe and lingula is unchanged. Minimal dependent bibasilar irregular interstitial opacity and septal thickening, unchanged compared to prior. Findings are most likely sequelae of prior infection or inflammation, mild fibrotic interstitial lung disease with an unusual distribution not however strictly excluded. 3. Status post bilateral mastectomy. 4. Coronary artery disease.     Electronically Signed   By: Delanna Ahmadi M.D.   On: 11/05/2021 13:34     ECHO 09/10/21  IMPRESSIONS     1. Left ventricular ejection fraction, by estimation, is 50 to 55%. The  left ventricle has low normal function. The left ventricle has no regional  wall motion abnormalities. Left  ventricular diastolic parameters are  consistent with Grade I diastolic  dysfunction (impaired relaxation).   2. Left atrial size was mild to moderately dilated.   3. The mitral valve is normal in structure. Mild mitral valve  regurgitation. No evidence of mitral stenosis.   Simple office walk 185 feet x  3 laps goal with forehead probe 01/01/2022    O2 used ra   Number laps completed 1 of 3   Comments about pace Slow with walker   Resting Pulse Ox/HR 98% and 73/min   Final Pulse Ox/HR 98% and 92/min   Desaturated </= 88% no   Desaturated <= 3% points no   Got Tachycardic >/= 90/min yes   Symptoms at end of test Modeate dyspnea, dizziness   Miscellaneous comments C/w deconditoneing        Latest Reference Range & Units 11/27/21 11:45  Anti Nuclear Antibody (ANA) NEGATIVE  POSITIVE !  ANA Pattern 1  Cytoplasmic !  ANA Titer 1 titer 1:50 (H)  Cyclic Citrullin Peptide Ab UNITS <16  ds DNA Ab IU/mL 1  Immunoglobulin A 70 - 320 mg/dL 247  RA Latex Turbid. <14 IU/mL <14  IgG (Immunoglobin G), Serum 600 - 1,540 mg/dL 894  SSA (Ro) (ENA) Antibody, IgG <1.0 NEG AI <1.0 NEG  SSB (La) (ENA) Antibody, IgG <1.0 NEG AI <1.0 NEG  !: Data is abnormal (H): Data is abnormally high  has a past medical history of Acute right eye pain (03/25/2021), Allergy status to unspecified drugs, medicaments and biological substances (02/23/2013), Asthma, mild persistent (03/02/2013), B12 deficiency (08/24/2017), Chronic anxiety (04/08/2020), Chronic depression (04/08/2020), Cognitive disorder (10/22/2012), Colon polyps, COPD (chronic obstructive pulmonary disease) ([place]), Dizziness (10/22/2012), DVT (deep venous thrombosis) ([place]), 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), Essential hypertension (10/22/2012), Gastroesophageal reflux disease without esophagitis (10/22/2012), 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), Heart attack ([place]), History of colon polyps (01/13/2017), History of hysterectomy (02/09/2017), History of recurrent UTIs (11/19/2016),  History of shingles (04/08/2020), Hyperlipidemia, Idiopathic peripheral neuropathy (01/30/2020), 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 (05/16/2018), Interstitial lung disease ([place]), Irregular heart rhythm (04/08/2020), Kidney disease, LBBB (left bundle branch block) (09/22/2017), Major depressive disorder, recurrent, 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/2020), 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 ([place]) (08/09/2012),  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   reports that she quit smoking about 31 years ago. Her smoking use included cigarettes. She started smoking about 65 years ago. She has a 3.30 pack-year smoking history. She has never used smokeless tobacco.  Past Surgical History:  Procedure Laterality Date   ABDOMINAL HYSTERECTOMY     APPENDECTOMY     BREAST LUMPECTOMY     CHOLECYSTECTOMY      Allergies  Allergen Reactions   Diltiazem Palpitations   Iodinated Contrast Media Anaphylaxis    Shock and seizures   Iodine Anaphylaxis    Contrast Media   Iodine-131 Anaphylaxis   Levalbuterol Hcl Anaphylaxis and Shortness Of Breath   Meperidine Hcl Anaphylaxis   Mometasone Anaphylaxis   Quinolones Itching and Rash   Tape Hives   Tiotropium Shortness Of Breath   Umeclidinium Palpitations   Asmanex (120 Metered Doses) [Mometasone Furoate]    Azithromycin Diarrhea and Nausea And Vomiting    Elemental Sulfur Rash    blisters   Statins     Patient states she had tried two or three and she states it causes her pain and she declines to take statin.    Sulfa Antibiotics Rash and Swelling    blisters    Sulfasalazine Rash    blisters   Valacyclovir Nausea And Vomiting    GI Upset   Celebrex [Celecoxib] Nausea And Vomiting   Codeine Nausea Only   Diltiazem Hcl Palpitations    Heart speeds up    Fluticasone Furoate-Vilanterol Nausea Only    Doesn't feel well.     Latex Rash   Lisinopril Rash   Pantoprazole Rash    Immunization History  Administered Date(s) Administered   Fluad Quad(high Dose 65+) 03/24/2021   Influenza, High Dose Seasonal PF 03/26/2005, 03/01/2007, 03/07/2008, 03/13/2009, 01/28/2010, 03/23/2012, 03/08/2013, 03/11/2015, 03/04/2017, 02/24/2018, 03/18/2019   Influenza,inj,Quad PF,6-35 Mos 01/30/2020   Influenza-Unspecified 03/22/2015   Pneumococcal Conjugate-13 06/28/2012, 03/22/2015, 01/07/2017   Pneumococcal Polysaccharide-23 06/28/2012   Pneumococcal-Unspecified 03/22/2015   Tdap 08/31/2013    Family History  Problem Relation Age of Onset   Hypertension Mother    Heart attack Mother    Diabetes Mother    Stroke Mother    Ovarian cancer Mother    Hypertension Father    Diabetes Father    Tuberculosis Father    Hypertension Brother    Diabetes Brother    Pulmonary fibrosis Brother    Diabetes Son    Hypertension Paternal Aunt    Hypertension Paternal Uncle    Hypertension Paternal Grandmother    Diabetes Paternal Grandmother    Hypertension Paternal Grandfather    Diabetes Paternal Grandfather    Hypertension Brother    Diabetes Brother    Hypertension Brother    Diabetes Brother      Current Outpatient Medications:    acetaminophen (TYLENOL) 325 MG tablet, Take 650 mg by mouth every 6 (six) hours as needed for pain or headache., Disp: , Rfl:    albuterol (PROVENTIL) (2.5 MG/3ML) 0.083% nebulizer solution, Take 3 mLs (2.5 mg total)  by nebulization every 4 (four) hours as needed  for wheezing or shortness of breath (please include nebulizer machine, hoses, and mask if needed.)., Disp: 30 mL, Rfl: 2   albuterol (VENTOLIN HFA) 108 (90 Base) MCG/ACT inhaler, Inhale 2 puffs into the lungs every 4 (four) hours as needed for wheezing or shortness of breath., Disp: 18 g, Rfl: 5   amLODipine (NORVASC) 2.5 MG tablet, Take 1 tablet (2.5 mg total) by mouth daily. DUE FOR FOLLOW UP APPT, Disp: 90 tablet, Rfl: 0   docusate sodium (COLACE) 100 MG capsule, Take 100 mg by mouth daily as needed for mild constipation., Disp: , Rfl:    ibuprofen (ADVIL) 200 MG tablet, Take 200 mg by mouth every 6 (six) hours as needed. 1-2 tabs prn, Disp: , Rfl:    LORazepam (ATIVAN) 1 MG tablet, Take 1 tablet (1 mg total) by mouth 2 (two) times daily as needed (Use very sparingly)., Disp: 30 tablet, Rfl: 0   meclizine (ANTIVERT) 25 MG tablet, Take 1 tablet (25 mg total) by mouth 3 (three) times daily as needed for dizziness or nausea., Disp: 30 tablet, Rfl: 3   Multiple Vitamin (MULTI-VITAMIN) tablet, Take 1 tablet by mouth daily., Disp: , Rfl:    omeprazole (PRILOSEC) 40 MG capsule, TAKE 1 CAPSULE(40 MG) BY MOUTH DAILY, Disp: 90 capsule, Rfl: 1   ondansetron (ZOFRAN-ODT) 8 MG disintegrating tablet, Take 1 tablet (8 mg total) by mouth every 8 (eight) hours as needed for nausea., Disp: 20 tablet, Rfl: 1   sertraline (ZOLOFT) 50 MG tablet, Take '50mg'$  (1 tablet) every morning and '25mg'$  (1/2 tablet) every evening for 1 week then increase to '50mg'$  twice daily., Disp: 180 tablet, Rfl: 1   traMADol (ULTRAM) 50 MG tablet, Take 1 tablet (50 mg total) by mouth every 6 (six) hours as needed., Disp: 30 tablet, Rfl: 0      Objective:   Vitals:   01/01/22 0850  BP: 114/68  Pulse: 72  Temp: 98 F (36.7 C)  TempSrc: Oral  SpO2: 96%  Weight: 174 lb (78.9 kg)  Height: '5\' 7"'$  (1.702 m)    Estimated body mass index is 27.25 kg/m as calculated from the following:   Height  as of this encounter: '5\' 7"'$  (1.702 m).   Weight as of this encounter: 174 lb (78.9 kg).  '@WEIGHTCHANGE'$ @  [employer]   01/01/22 0850  Weight: 174 lb (78.9 kg)     Physical Exam   General: No distress.  Looks somewhat frail.  Deconditioned.  Has a walker Neuro: Alert and Oriented x 3. GCS 15. Speech normal Psych: Pleasant Resp:  Barrel Chest - no.  Wheeze - no, Crackles -crackles in the lingula and bilateral lower lobe crackles, No overt respiratory distress CVS: Normal heart sounds. Murmurs - no Ext: Stigmata of Connective Tissue Disease - no HEENT: Normal upper airway. PEERL +. No post nasal drip        Assessment:       ICD-10-CM   1. DOE (dyspnea on exertion)  R06.09 Pulmonary function test    2. ILD (interstitial lung disease) (HCC)  J84.9 Pulmonary function test    3. Diastolic dysfunction  Z61.09     4. Physical deconditioning  R53.81      ILD features are not consistent with UIP.  I suspect this is related to previous Cytoxan or infection.  She had a scan in February 2022 and currently.  Not sure there is progression.  She has moderate restriction on the PFTs.  If there is progression she would be  progressive phenotype non-- IPF and antifibrotic's would be indicated.  Regardless she has multiple drug allergies and when I mention the different side effects of antifibrotic's she and her daughter immediately rejected.  I agree with the sentiment on this.  This is a monitoring approach.  We talked about physical deconditioning and pulmonary rehabilitation but she feels she cannot participate in rehab.  We discussed oxygen therapy as an option with very minimal side effects but she will have to qualify.  We will get over no and walking desaturation test and reassess.  We will continue to follow her in clinic    Plan:     Patient Instructions     ICD-10-CM   1. DOE (dyspnea on exertion)  R06.09     2. ILD (interstitial lung disease) ([place])  J84.9     3. Diastolic  dysfunction  O11.57     4. Physical deconditioning  R53.81      The shortness of breath is multifactorial and is because of your scar tissue in the lung [AKA pulmonary fibrosis interstitial lung disease] mild heart muscle stiffness [diastolic dysfunction] and general body deconditioning  Overall have a very difficult situation to treat  Plan  - For pulmonary fibrosis/interstitial lung disease: Recommend serial monitoring.  Antifibrotic's have too many side effects and with her allergy profile it is not a good option for you  -For physical deconditioning: Ideally recommend pulm rehabilitation but with the joint issues this is not possible  -For heart muscle stiffness: Please talk to your cardiologist  -General: Recommend walking desaturation test in the office today and overnight pulse oximetry.  If these are abnormal oxygen therapy can help you  Follow-up  - Do spirometry and DLCO in 3 months - 4 months -Return to see Dr. Chase Caller in 3 months  - 4 months;  30-minute visit   ( Level 05 visit: Estb 40-54 min in  visit type: on-site physical face to visit  in total care time and counseling or/and coordination of care by this undersigned MD - Dr Brand Males. This includes one or more of the following on this same day 01/01/2022: pre-charting, chart review, note writing, documentation discussion of test results, diagnostic or treatment recommendations, prognosis, risks and benefits of management options, instructions, education, compliance or risk-factor reduction. It excludes time spent by the [place] or office staff in the care of the patient. Actual time 19 min)    SIGNATURE    Dr. Brand Males, M.D., F.C.C.P,  Pulmonary and Critical Care Medicine Staff Physician, [place] Director - Interstitial Lung Disease  Program  Pulmonary [place] at [place], [place], [address]  Pager: [phone], If no answer or between   15:00h - 7:00h: call [phone] Telephone: [phone]  6:49 PM 01/01/2022

## 2022-01-07 ENCOUNTER — Encounter: Payer: Self-pay | Admitting: General Practice

## 2022-02-24 DIAGNOSIS — F411 Generalized anxiety disorder: Secondary | ICD-10-CM | POA: Diagnosis not present

## 2022-02-24 DIAGNOSIS — F330 Major depressive disorder, recurrent, mild: Secondary | ICD-10-CM | POA: Diagnosis not present

## 2022-02-24 DIAGNOSIS — F451 Undifferentiated somatoform disorder: Secondary | ICD-10-CM | POA: Diagnosis not present

## 2022-03-12 ENCOUNTER — Other Ambulatory Visit: Payer: Self-pay | Admitting: Physician Assistant

## 2022-03-12 DIAGNOSIS — R110 Nausea: Secondary | ICD-10-CM

## 2022-03-13 ENCOUNTER — Ambulatory Visit (INDEPENDENT_AMBULATORY_CARE_PROVIDER_SITE_OTHER): Payer: Medicare Other

## 2022-03-13 ENCOUNTER — Encounter: Payer: Self-pay | Admitting: Physician Assistant

## 2022-03-13 ENCOUNTER — Ambulatory Visit (INDEPENDENT_AMBULATORY_CARE_PROVIDER_SITE_OTHER): Payer: Medicare Other | Admitting: Physician Assistant

## 2022-03-13 VITALS — BP 131/69 | HR 66 | Ht 68.0 in | Wt 175.0 lb

## 2022-03-13 DIAGNOSIS — Z23 Encounter for immunization: Secondary | ICD-10-CM | POA: Diagnosis not present

## 2022-03-13 DIAGNOSIS — E538 Deficiency of other specified B group vitamins: Secondary | ICD-10-CM | POA: Diagnosis not present

## 2022-03-13 DIAGNOSIS — M19012 Primary osteoarthritis, left shoulder: Secondary | ICD-10-CM | POA: Diagnosis not present

## 2022-03-13 DIAGNOSIS — W19XXXA Unspecified fall, initial encounter: Secondary | ICD-10-CM

## 2022-03-13 DIAGNOSIS — M25532 Pain in left wrist: Secondary | ICD-10-CM

## 2022-03-13 DIAGNOSIS — R1024 Suprapubic pain: Secondary | ICD-10-CM

## 2022-03-13 DIAGNOSIS — R42 Dizziness and giddiness: Secondary | ICD-10-CM

## 2022-03-13 DIAGNOSIS — R2689 Other abnormalities of gait and mobility: Secondary | ICD-10-CM

## 2022-03-13 DIAGNOSIS — R3989 Other symptoms and signs involving the genitourinary system: Secondary | ICD-10-CM | POA: Diagnosis not present

## 2022-03-13 DIAGNOSIS — R29898 Other symptoms and signs involving the musculoskeletal system: Secondary | ICD-10-CM

## 2022-03-13 DIAGNOSIS — M25512 Pain in left shoulder: Secondary | ICD-10-CM

## 2022-03-13 DIAGNOSIS — M189 Osteoarthritis of first carpometacarpal joint, unspecified: Secondary | ICD-10-CM | POA: Diagnosis not present

## 2022-03-13 DIAGNOSIS — R102 Pelvic and perineal pain: Secondary | ICD-10-CM

## 2022-03-13 LAB — POCT URINALYSIS DIP (CLINITEK)
Bilirubin, UA: NEGATIVE
Blood, UA: NEGATIVE
Glucose, UA: NEGATIVE mg/dL
Nitrite, UA: NEGATIVE
POC PROTEIN,UA: NEGATIVE
Spec Grav, UA: >=1.03 — AB (ref 1.010–1.025)
Urobilinogen, UA: 0.2 E.U./dL
pH, UA: 6 (ref 5.0–8.0)

## 2022-03-13 MED ORDER — DICLOFENAC SODIUM 1 % EX GEL: 4.0000 g | Freq: Four times a day (QID) | CUTANEOUS | 1 refills | Status: AC

## 2022-03-13 NOTE — Progress Notes (Addendum)
Acute Office Visit  Subjective:     Patient ID: Courtney Cummings, female    DOB: 1941-02-01, 81 y.o.   MRN: [id number]  Chief Complaint  Patient presents with   Fall    HPI Patient is in today after fall 5 days ago on Sunday night. She was going to the bathroom and her walker got stuck on a lift in the doorway to the bathroom and patient said she fell forward over the front of her walker and hit her left forehead and side of body on the sink, toilet and floor. She is unaware if she loss consciousness or not but she laid in the floor for awhile and then crawled back to the bedroom and laid on floor until daughter got there.   Daughter accompanies her today and has been watching her more closely this week.   She is very sore. She did not go to ED. She has had a headache that comes and goes. Her left radial wrist and shoulder hurt. She is able to use them. She feels like her legs are weaker than they have been and balance is off.   She is also having some suprapubic pressure and wonders about UTI. Hx of UTIs. No fever, chills, nausea, flank pain.   .. 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   Essential hypertension 10/22/2012   Gastroesophageal reflux disease without esophagitis 10/22/2012   Generalized anxiety disorder 11/15/2012   History of colon polyps 01/13/2017   History of hysterectomy 02/09/2017   History of recurrent UTIs 11/19/2016   History of shingles 04/08/2020   Hyperlipidemia 10/22/2012   Idiopathic peripheral neuropathy 01/30/2020   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 05/16/2018   Irregular heart rhythm 04/08/2020   LBBB (left bundle branch block)  09/22/2017   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 (HCC) 08/09/2012   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   Nuclear sclerotic cataract of left eye 06/25/2016   Osteoarthrosis 10/22/2012   Other nonspecific abnormal finding of lung field 03/02/2013   Personal history of malignant neoplasm of breast 09/23/2011   Recurrent falls 01/07/2017   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   Statin intolerance 07/21/2017   Transient ischemic attack (TIA) 03/04/2017   Prediabetes 04/08/2020   Major depressive disorder, recurrent, 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   Trigeminal neuralgia of right side of face 03/25/2021   Acute right eye pain 03/25/2021   Right-sided headache 04/02/2021   Colon polyps 08/26/2021   COPD (chronic obstructive pulmonary disease) (HCC) 08/26/2021   DVT (deep venous thrombosis) (HCC) 08/26/2021   Heart attack (HCC) 08/26/2021   ILD (interstitial lung disease) (HCC) 08/26/2021   Kidney disease 08/26/2021   MRSA (methicillin resistant Staphylococcus aureus) 08/26/2021   Abnormal EKG 08/28/2021   Palpitations 08/28/2021   Cardiac murmur 08/28/2021   Pneumonia of right upper lobe due to infectious organism 10/31/2021   Weakness 10/31/2021   Subacute cough 10/31/2021   Shortness of breath 10/31/2021   Herpes zoster without complication 11/14/2021   Pneumonia of left upper lobe due to infectious organism 11/14/2021   Bilateral leg weakness 03/13/2022   Fall 03/16/2022   Left wrist pain 03/16/2022  Acute pain of left shoulder 03/16/2022   Balance problem 03/16/2022   Suprapubic pressure 03/16/2022   Resolved Ambulatory Problems    Diagnosis Date Noted   No Resolved Ambulatory Problems   Past Medical History:  Diagnosis Date   Interstitial lung disease (HCC)      ROS See HPI.      Objective:    BP 131/69   Pulse 66   Ht 5\' 8"  (1.727 m)   Wt 175 lb (79.4 kg)   SpO2 97%    BMI 26.61 kg/m  BP Readings from Last 3 Encounters:  03/13/22 131/69  01/01/22 114/68  11/27/21 106/70   Wt Readings from Last 3 Encounters:  03/13/22 175 lb (79.4 kg)  01/01/22 174 lb (78.9 kg)  11/27/21 172 lb 9.6 oz (78.3 kg)    .Marland Kitchen Results for orders placed or performed in visit on 03/13/22  Urine Culture   Specimen: Urine  Result Value Ref Range   MICRO NUMBER: [id number]    SPECIMEN QUALITY: Adequate    Sample Source URINE    STATUS: FINAL    Result: No Growth   B12 and Folate Panel  Result Value Ref Range   Vitamin B-12 458 200 - 1,100 pg/mL   Folate >24.0 ng/mL  CBC w/Diff/Platelet  Result Value Ref Range   WBC 6.4 3.8 - 10.8 Thousand/uL   RBC 4.49 3.80 - 5.10 Million/uL   Hemoglobin 13.7 11.7 - 15.5 g/dL   HCT 96.2 95.2 - 84.1 %   MCV 89.1 80.0 - 100.0 fL   MCH 30.5 27.0 - 33.0 pg   MCHC 34.3 32.0 - 36.0 g/dL   RDW 32.4 40.1 - 02.7 %   Platelets 226 140 - 400 Thousand/uL   MPV 9.4 7.5 - 12.5 fL   Neutro Abs 3,827 1,500 - 7,800 cells/uL   Lymphs Abs 2,106 850 - 3,900 cells/uL   Absolute Monocytes 282 200 - 950 cells/uL   Eosinophils Absolute 147 15 - 500 cells/uL   Basophils Absolute 38 0 - 200 cells/uL   Neutrophils Relative % 59.8 %   Total Lymphocyte 32.9 %   Monocytes Relative 4.4 %   Eosinophils Relative 2.3 %   Basophils Relative 0.6 %  TSH  Result Value Ref Range   TSH 2.23 0.40 - 4.50 mIU/L  COMPLETE METABOLIC PANEL WITH GFR  Result Value Ref Range   Glucose, Bld 154 (H) 65 - 99 mg/dL   BUN 16 7 - 25 mg/dL   Creat 2.53 6.64 - 4.03 mg/dL   eGFR 67 > OR = 60 KV/QQV/9.56L8   BUN/Creatinine Ratio SEE NOTE: 6 - 22 (calc)   Sodium 141 135 - 146 mmol/L   Potassium 4.1 3.5 - 5.3 mmol/L   Chloride 105 98 - 110 mmol/L   CO2 26 20 - 32 mmol/L   Calcium 9.4 8.6 - 10.4 mg/dL   Total Protein 6.9 6.1 - 8.1 g/dL   Albumin 4.3 3.6 - 5.1 g/dL   Globulin 2.6 1.9 - 3.7 g/dL (calc)   AG Ratio 1.7 1.0 - 2.5 (calc)   Total Bilirubin 0.4 0.2 - 1.2 mg/dL    Alkaline phosphatase (APISO) 93 37 - 153 U/L   AST 22 10 - 35 U/L   ALT 24 6 - 29 U/L  POCT URINALYSIS DIP (CLINITEK)  Result Value Ref Range   Color, UA yellow yellow   Clarity, UA clear clear   Glucose, UA negative negative mg/dL   Bilirubin, UA negative negative  Ketones, POC UA trace (5) (A) negative mg/dL   Spec Grav, UA >=1.610 (A) 1.010 - 1.025   Blood, UA negative negative   pH, UA 6.0 5.0 - 8.0   POC PROTEIN,UA negative negative, trace   Urobilinogen, UA 0.2 0.2 or 1.0 E.U./dL   Nitrite, UA Negative Negative   Leukocytes, UA Trace (A) Negative     Physical Exam Vitals reviewed.  Constitutional:      Appearance: Normal appearance.  HENT:     Head:      Comments: Extensive bruising over right forehead Cardiovascular:     Rate and Rhythm: Normal rate and regular rhythm.  Pulmonary:     Effort: Pulmonary effort is normal.  Abdominal:     General: Bowel sounds are normal. There is no distension.     Palpations: Abdomen is soft. There is no mass.     Tenderness: There is no abdominal tenderness. There is no right CVA tenderness, left CVA tenderness, guarding or rebound.  Musculoskeletal:     Right lower leg: No edema.     Left lower leg: No edema.     Comments: No ambulating well due to bilateral leg strength and balance Tenderness to palpation over radial right wrist to palpation  Decreased ROM of left shoulder with tenderness to palpation over [facility] joint  Neurological:     General: No focal deficit present.     Mental Status: She is alert and oriented to person, place, and time.  Psychiatric:        Mood and Affect: Mood normal.           Assessment & Plan:  Marland KitchenMarland KitchenMyra was seen today for fall.  Diagnoses and all orders for this visit:  Fall, initial encounter -     B12 and Folate Panel -     CBC w/Diff/Platelet -     TSH -     COMPLETE METABOLIC PANEL WITH GFR -     Ambulatory referral to Home Health  Need for immunization against influenza -     Flu  Vaccine QUAD High Dose(Fluad)  Dizziness -     POCT URINALYSIS DIP (CLINITEK) -     B12 and Folate Panel -     CBC w/Diff/Platelet -     TSH -     COMPLETE METABOLIC PANEL WITH GFR -     Urine Culture  Left wrist pain -     DG Wrist Complete Left; Future -     diclofenac Sodium (VOLTAREN) 1 % GEL; Apply 4 g topically 4 (four) times daily. To affected joint.  Acute pain of left shoulder -     DG Shoulder Left; Future -     diclofenac Sodium (VOLTAREN) 1 % GEL; Apply 4 g topically 4 (four) times daily. To affected joint.  B12 deficiency -     B12 and Folate Panel  Bilateral leg weakness -     Ambulatory referral to Home Health  Balance problem -     Ambulatory referral to Home Health  Suprapubic pressure    Fall- get xray of left shoulder and wrist Ice areas Voltaren gel as needed Labs ordered Home PT ordered Trace leuks Will send off for culture Drink plenty of water Follow up with PcP in 4-6 weeks    Tandy Gaw, PA-C

## 2022-03-13 NOTE — Patient Instructions (Addendum)
Will order PT for leg weakness and balance Will check labs today Voltaren gel for painful joints Get xray today

## 2022-03-14 LAB — COMPLETE METABOLIC PANEL WITH GFR
AG Ratio: 1.7 (calc) (ref 1.0–2.5)
ALT: 24 U/L (ref 6–29)
AST: 22 U/L (ref 10–35)
Albumin: 4.3 g/dL (ref 3.6–5.1)
Alkaline phosphatase (APISO): 93 U/L (ref 37–153)
BUN: 16 mg/dL (ref 7–25)
CO2: 26 mmol/L (ref 20–32)
Calcium: 9.4 mg/dL (ref 8.6–10.4)
Chloride: 105 mmol/L (ref 98–110)
Creat: 0.87 mg/dL (ref 0.60–0.95)
Globulin: 2.6 g/dL (calc) (ref 1.9–3.7)
Glucose, Bld: 154 mg/dL — ABNORMAL HIGH (ref 65–99)
Potassium: 4.1 mmol/L (ref 3.5–5.3)
Sodium: 141 mmol/L (ref 135–146)
Total Bilirubin: 0.4 mg/dL (ref 0.2–1.2)
Total Protein: 6.9 g/dL (ref 6.1–8.1)
eGFR: 67 mL/min/{1.73_m2} (ref >=60)

## 2022-03-14 LAB — CBC WITH DIFFERENTIAL/PLATELET
Absolute Monocytes: 282 cells/uL (ref 200–950)
Basophils Absolute: 38 cells/uL (ref 0–200)
Basophils Relative: 0.6 %
Eosinophils Absolute: 147 cells/uL (ref 15–500)
Eosinophils Relative: 2.3 %
HCT: 40 % (ref 35.0–45.0)
Hemoglobin: 13.7 g/dL (ref 11.7–15.5)
Lymphs Abs: 2106 cells/uL (ref 850–3900)
MCH: 30.5 pg (ref 27.0–33.0)
MCHC: 34.3 g/dL (ref 32.0–36.0)
MCV: 89.1 fL (ref 80.0–100.0)
MPV: 9.4 fL (ref 7.5–12.5)
Monocytes Relative: 4.4 %
Neutro Abs: 3827 cells/uL (ref 1500–7800)
Neutrophils Relative %: 59.8 %
Platelets: 226 10*3/uL (ref 140–400)
RBC: 4.49 10*6/uL (ref 3.80–5.10)
RDW: 13.1 % (ref 11.0–15.0)
Total Lymphocyte: 32.9 %
WBC: 6.4 10*3/uL (ref 3.8–10.8)

## 2022-03-14 LAB — B12 AND FOLATE PANEL
Folate: >24 ng/mL
Vitamin B-12: 458 pg/mL (ref 200–1100)

## 2022-03-14 LAB — TSH: TSH: 2.23 mIU/L (ref 0.40–4.50)

## 2022-03-15 LAB — URINE CULTURE
MICRO NUMBER:: 14112968
Result:: NO GROWTH
SPECIMEN QUALITY:: ADEQUATE

## 2022-03-16 DIAGNOSIS — R102 Pelvic and perineal pain: Secondary | ICD-10-CM | POA: Insufficient documentation

## 2022-03-16 DIAGNOSIS — M25512 Pain in left shoulder: Secondary | ICD-10-CM | POA: Insufficient documentation

## 2022-03-16 DIAGNOSIS — R2689 Other abnormalities of gait and mobility: Secondary | ICD-10-CM | POA: Insufficient documentation

## 2022-03-16 DIAGNOSIS — M25532 Pain in left wrist: Secondary | ICD-10-CM | POA: Insufficient documentation

## 2022-03-16 DIAGNOSIS — W19XXXA Unspecified fall, initial encounter: Secondary | ICD-10-CM | POA: Insufficient documentation

## 2022-03-16 NOTE — Progress Notes (Signed)
Kidney looks ok.  No bacteria growth on urine culture.  Vitamin b12 looks good.  Hemoglobin and WBC looks good.  Thyroid looks great.

## 2022-03-17 NOTE — Progress Notes (Signed)
No acute fracture. Arthritis seen in [facility] joint.

## 2022-03-17 NOTE — Progress Notes (Signed)
Arthritis in shoulder but no acute fracture.

## 2022-05-03 IMAGING — MR MR HEAD WO/W CM
13 series · 48 of 48 positions shown · IV contrast (gadavist)
Comparison: 03/08/2019 MRI head with and without contrast

CLINICAL DATA: Trigeminal neuralgia for 4 weeks, right side into
right eye, history of breast cancer

EXAM:
MRI HEAD WITHOUT AND WITH CONTRAST
MRI FACE TRIGEMINAL WITHOUT AND WITH CONTRAST
TECHNIQUE: Multiplanar, multiecho pulse sequences of the brain and surrounding
structures were obtained without and with intravenous contrast.
Multiplanar, multi-echo pulse sequences of the face and surrounding
structures, including thin-slice imaging of the trigeminal nerves,
were acquired before and after intravenous contrast administration.
CONTRAST:  7.5mL GADAVIST GADOBUTROL 1 MMOL/ML IV SOLN

[Series 2: DWI · axial · 3.0mm · 1.35mm/px · z∈[-44,+117]mm · 7 of 108 slices shown (1 of 4)]
[im 1/108]
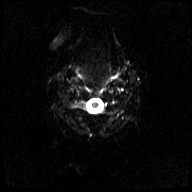
[im 18/108]
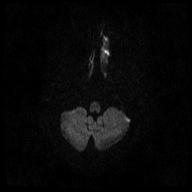
[im 36/108]
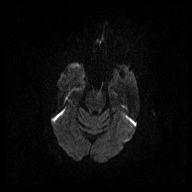
[im 54/108]
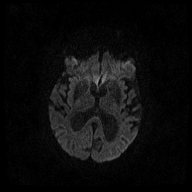
[im 72/108]
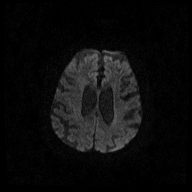
[im 90/108]
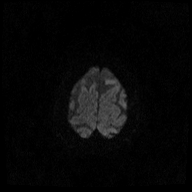
[im 108/108]
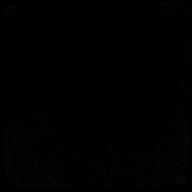

[Series 3: DWI · axial · 3.0mm · 1.35mm/px · z∈[-44,+117]mm · 4 of 55 slices shown (2 of 4)]
[im 1/55]
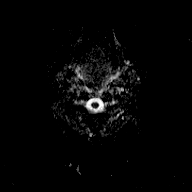
[im 19/55]
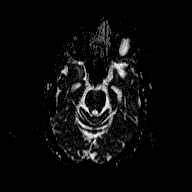
[im 37/55]
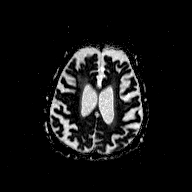
[im 55/55]
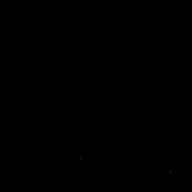

[Series 4: T1 · sagittal · 5.0mm · 0.45mm/px · 1 of 23 slices shown (1 of 2)]
[im 1/23]
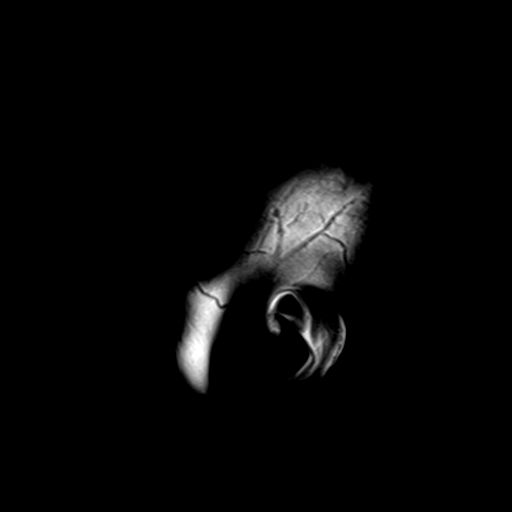

[Series 5: DWI · coronal · 3.0mm · 1.46mm/px · 5 of 93 slices shown (3 of 4)]
[im 1/93]
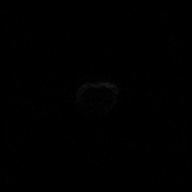
[im 24/93]
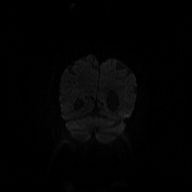
[im 47/93]
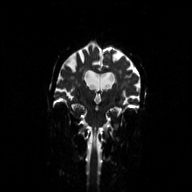
[im 70/93]
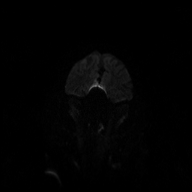
[im 93/93]
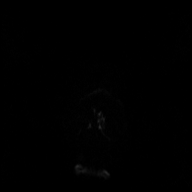

[Series 6: DWI · coronal · 3.0mm · 1.46mm/px · 3 of 47 slices shown (4 of 4)]
[im 1/47]
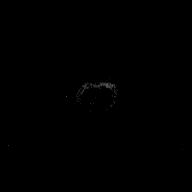
[im 24/47]
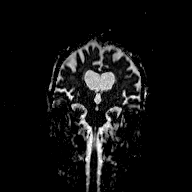
[im 47/47]
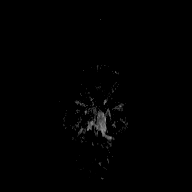

[Series 7: T2 · axial · 5.0mm · 0.60mm/px · 1 of 24 slices shown (1 of 3)]
[im 1/24]
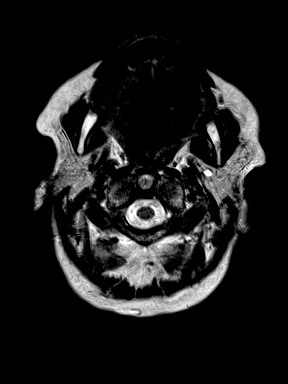

[Series 8: FLAIR · axial · 3.0mm · 0.72mm/px · z∈[-48,+114]mm · 3 of 55 slices shown]
[im 1/55]
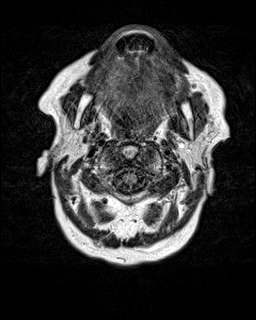
[im 28/55]
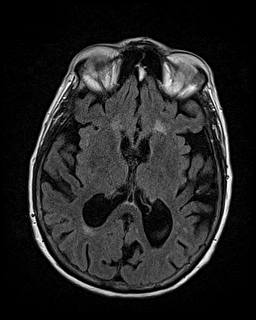
[im 55/55]
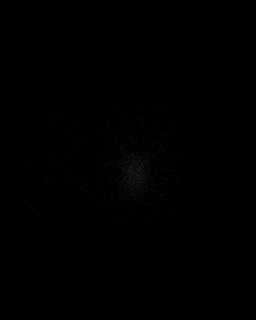

[Series 9: T2 · axial · 5.0mm · 0.72mm/px · 1 of 24 slices shown (2 of 3)]
[im 1/24]
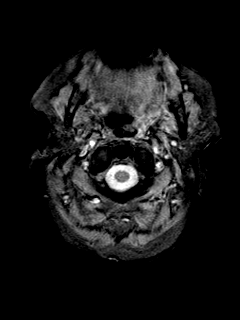

[Series 10: T1 · axial · 1.0mm · 0.94mm/px · z∈[-41,+117]mm · 9 of 160 slices shown (2 of 2)]
[im 1/160]
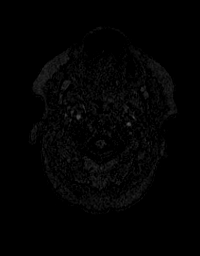
[im 20/160]
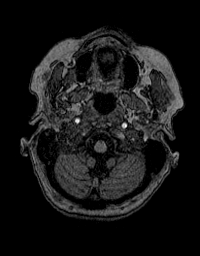
[im 40/160]
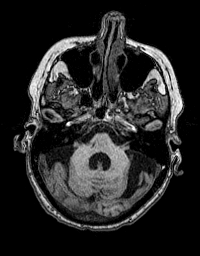
[im 60/160]
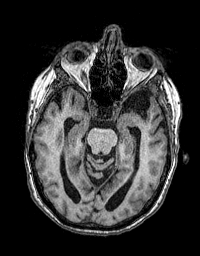
[im 80/160]
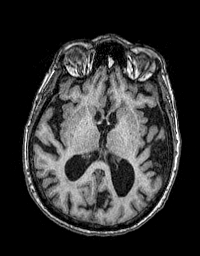
[im 100/160]
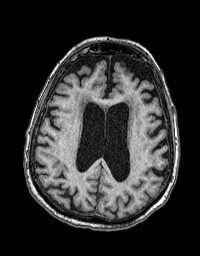
[im 120/160]
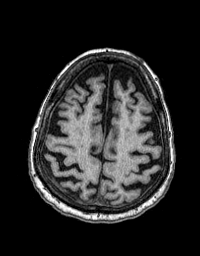
[im 140/160]
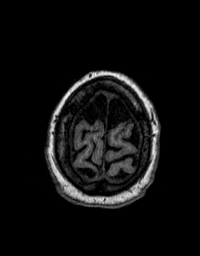
[im 160/160]
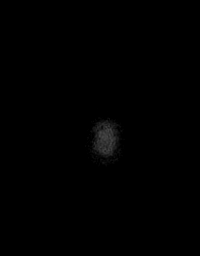

[Series 11: T2 · coronal · 5.0mm · 0.57mm/px · 2 of 29 slices shown (3 of 3)]
[im 1/29]
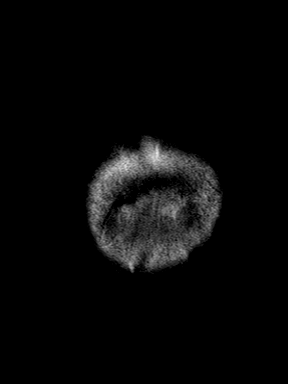
[im 29/29]
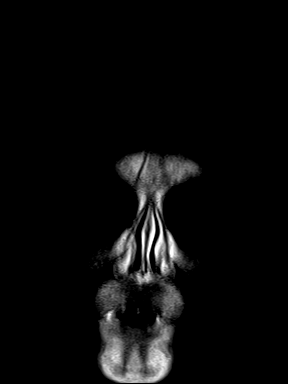

[Series 21: T1 post-contrast · axial · 1.0mm · 0.94mm/px · z∈[-41,+117]mm · 9 of 160 slices shown (1 of 3)]
[im 1/160]
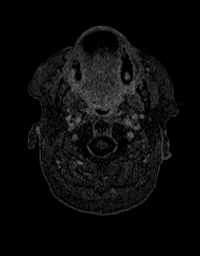
[im 20/160]
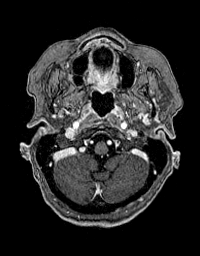
[im 40/160]
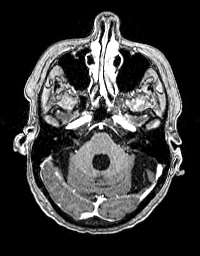
[im 60/160]
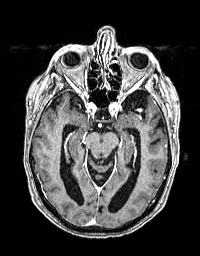
[im 80/160]
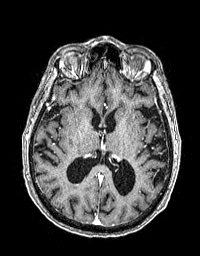
[im 100/160]
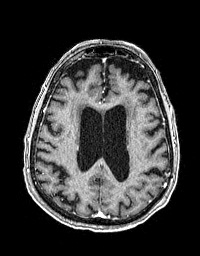
[im 120/160]
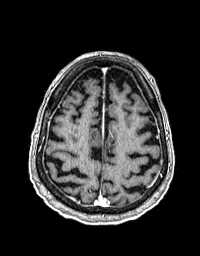
[im 140/160]
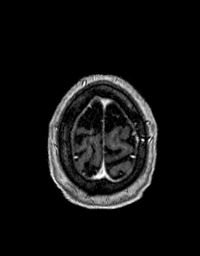
[im 160/160]
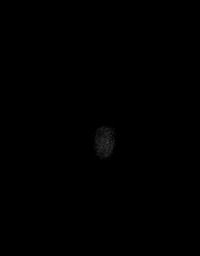

[Series 22: T1 post-contrast · coronal · 5.0mm · 0.43mm/px · 2 of 29 slices shown (2 of 3)]
[im 1/29]
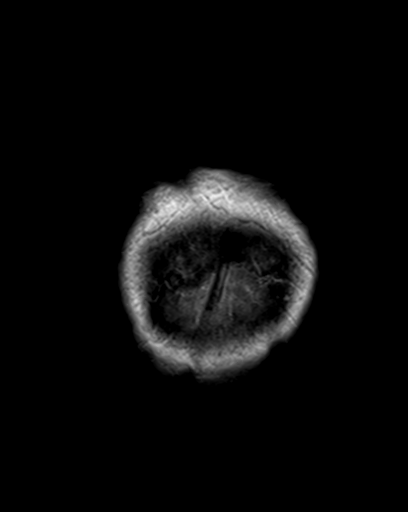
[im 29/29]
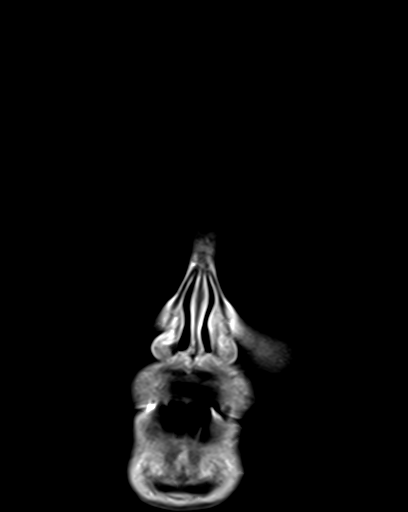

[Series 23: T1 post-contrast · sagittal · 5.0mm · 0.45mm/px · 1 of 23 slices shown (3 of 3)]
[im 1/23]
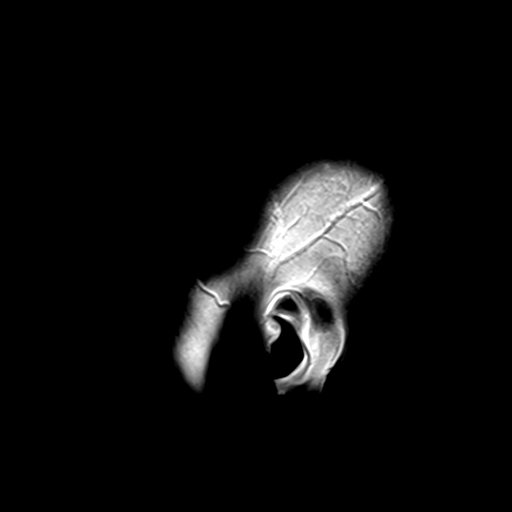

[48 of 48 positions shown; findings below may reference images not displayed]

FINDINGS: Brain: No acute infarction, hemorrhage, hydrocephalus, extra-axial
collection or mass lesion.

Trigeminal nerves: No mass is identified along the course of the
trigeminal nerves on thin-slice imaging. No evidence of
neurovascular compression.

Vascular: Normal flow voids.

Sinuses/Orbits: The paranasal sinuses are clear. Status post left
lens replacement. Trace fluid in the right-greater-than-left mastoid
air cells.

Soft tissues: Normal.

Skull and upper cervical spine: Normal marrow signal.

Other: None.
IMPRESSION: 1. No acute intracranial process.
2. No evidence of neurovascular compression or mass along the course
of the trigeminal nerves.

## 2022-05-06 ENCOUNTER — Telehealth: Payer: Self-pay | Admitting: Internal Medicine

## 2022-05-06 NOTE — Telephone Encounter (Signed)
Called and spoke with patient. She states after her last visit with MR she is unsure if she wants to keep seeing him. She stated he had told her there was nothing he could do for her and that she was wasting his time. I advised we could schedule her with another provider, she was unsure if she wanted to do that either. I advised her in MR's last note from August he had wanted her to come back in 3-4 months. I offered her an appointment with him. She advised she would discuss with daughter and give [place] a call back

## 2022-05-12 IMAGING — CT CT CHEST-ABD-PELV W/O CM
2 of 4 series · 14 of 36 positions shown, 16 images · non-contrast
Comparison: CT abdomen pelvis, 11/05/2020, CT chest abdomen
pelvis,, 07/08/2020

CLINICAL DATA: Right axillary fullness, remote history of right
breast cancer 17 years ago, status post bilateral mastectomy

EXAM:
CT CHEST, ABDOMEN AND PELVIS WITHOUT CONTRAST
TECHNIQUE: Multidetector CT imaging of the chest, abdomen and pelvis was
performed following the standard protocol without IV contrast. Oral
enteric contrast was administered.

[Series 2: axial st · axial · 0.88mm/px · z∈[-622,-72]mm · 11 of 132 slices shown, 13 images]
[im 11/132  mediastinal]
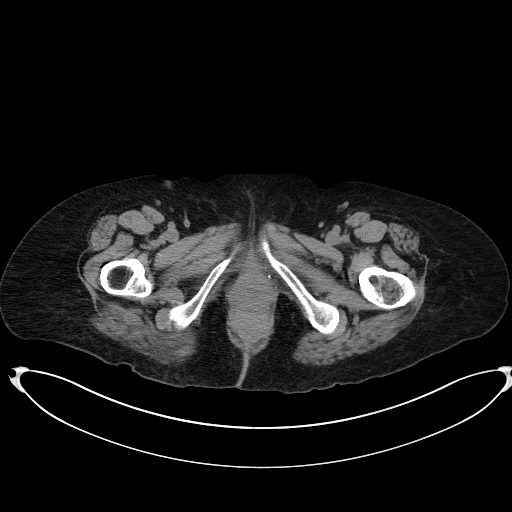
[im 11/132  bone]
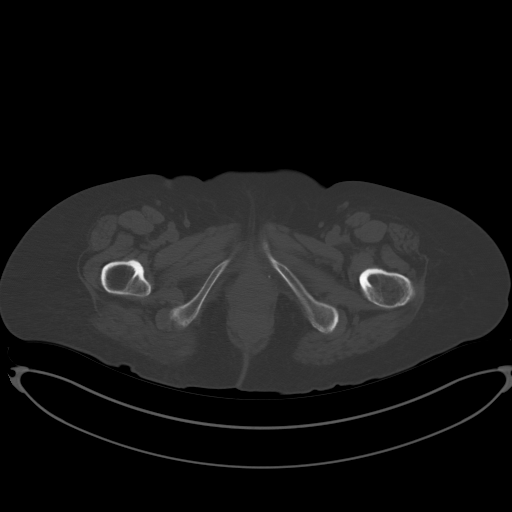
[im 22/132  mediastinal]
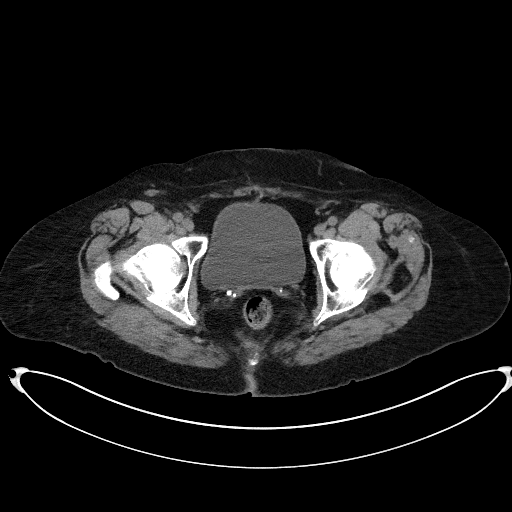
[im 33/132  mediastinal]
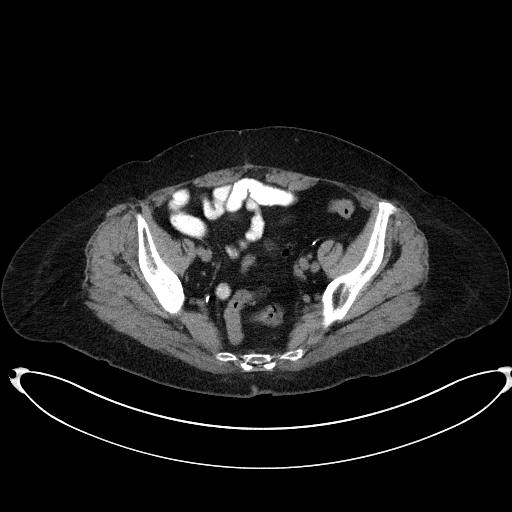
[im 44/132  mediastinal]
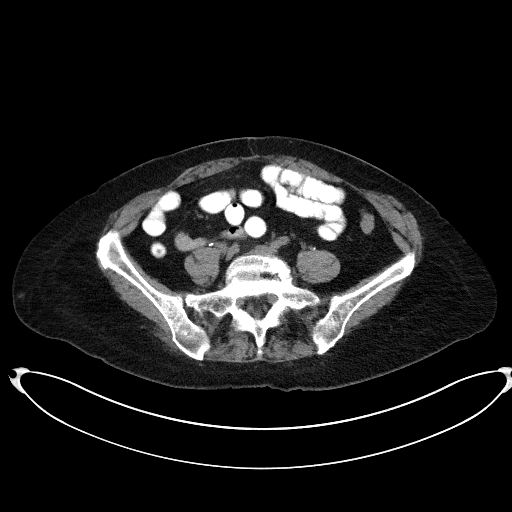
[im 55/132  mediastinal]
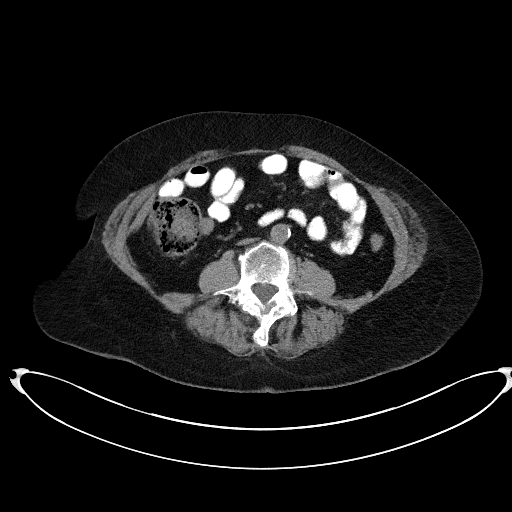
[im 66/132  mediastinal]
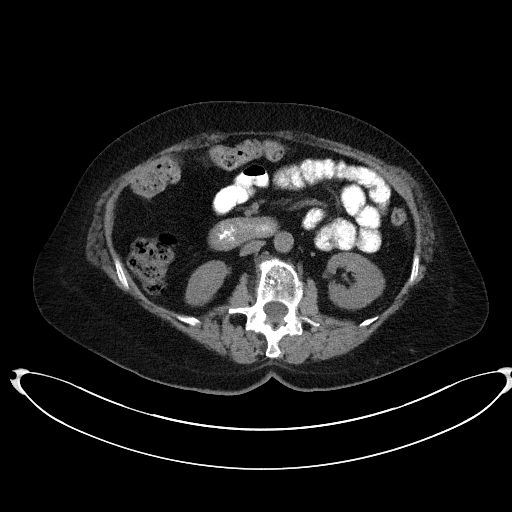
[im 77/132  mediastinal]
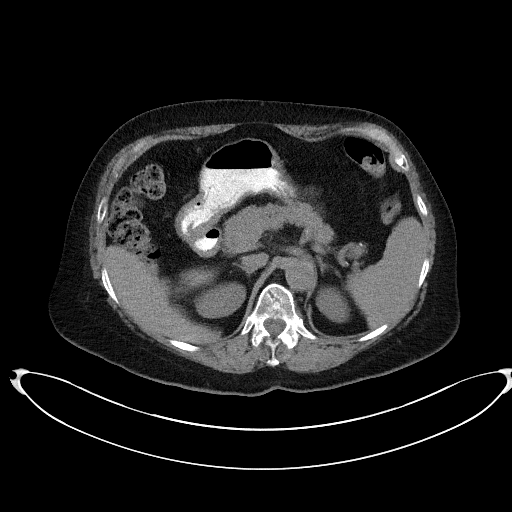
[im 88/132  mediastinal]
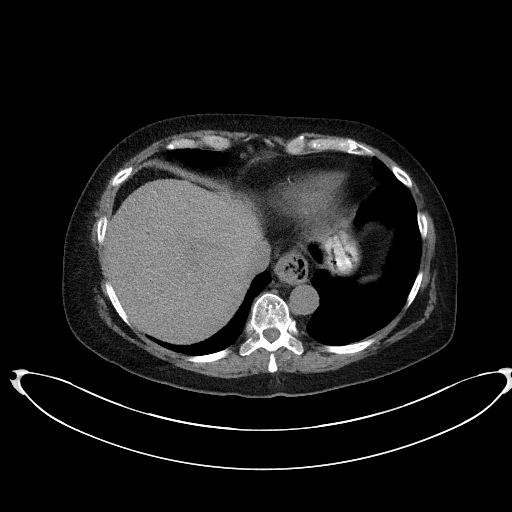
[im 99/132  mediastinal]
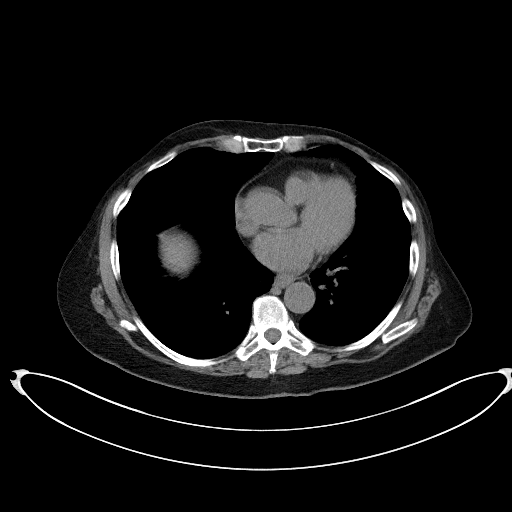
[im 99/132  bone]
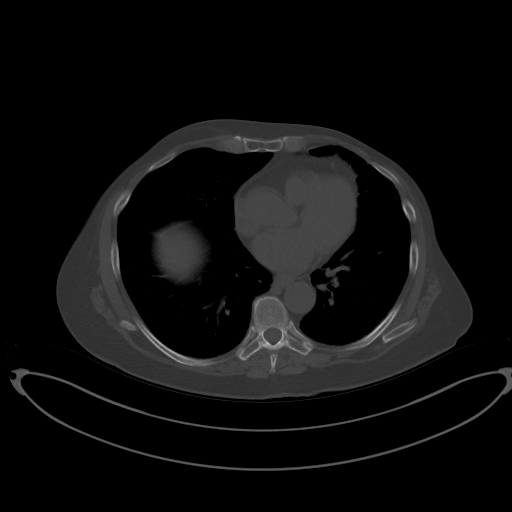
[im 110/132  mediastinal]
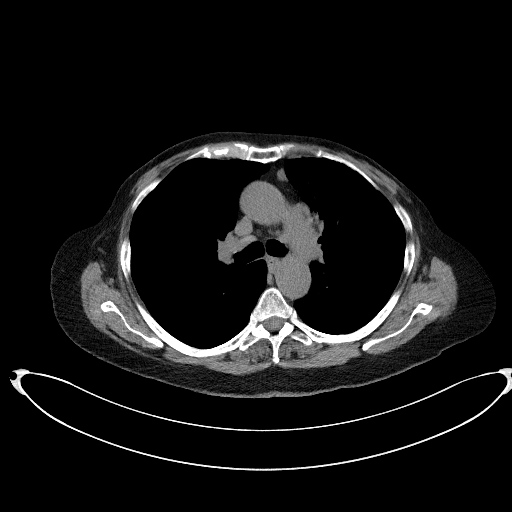
[im 121/132  mediastinal]
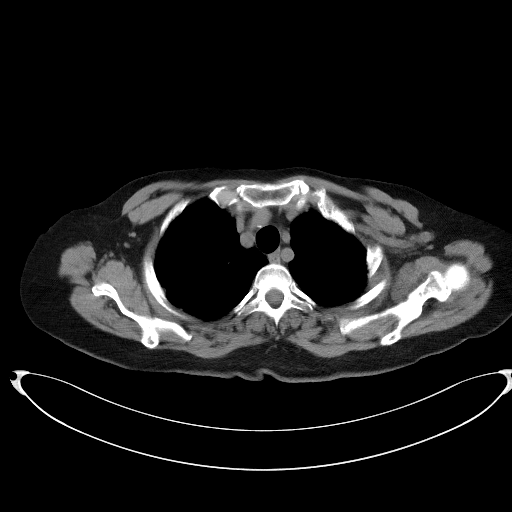

[Series 5: coronal · coronal · 0.79mm/px · 3 of 135 slices shown]
[im 27/135  mediastinal]
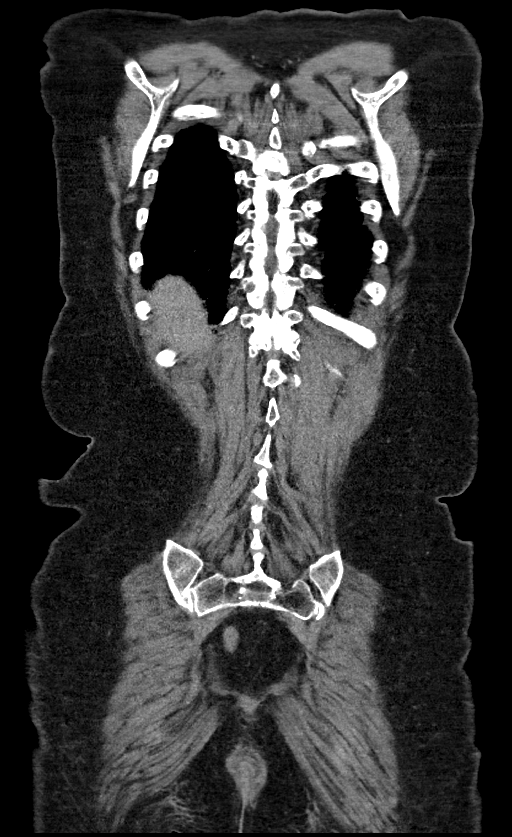
[im 54/135  mediastinal]
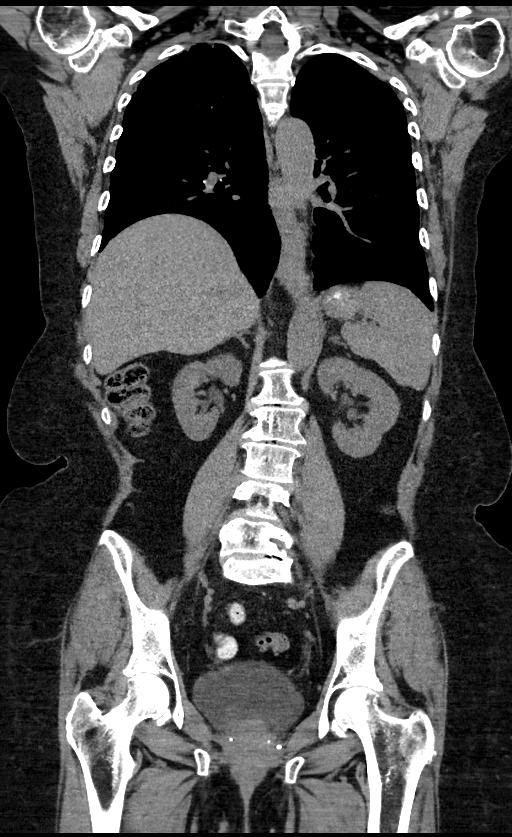
[im 81/135  mediastinal]
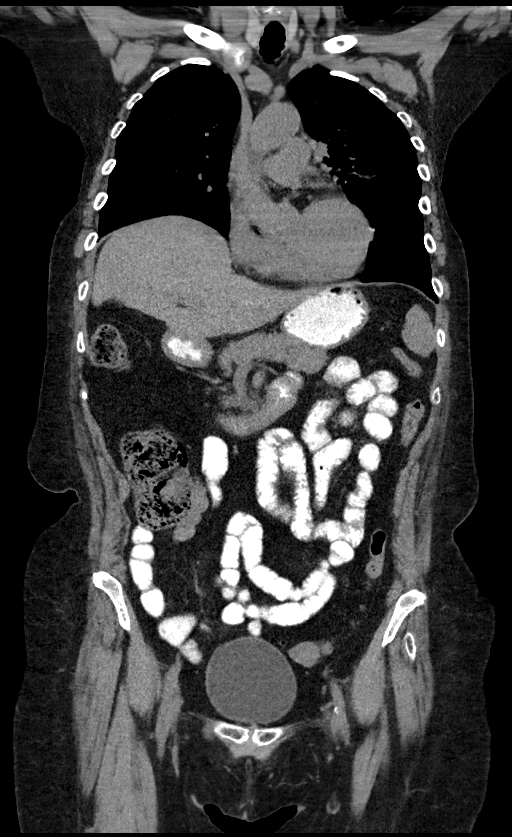

[14 of 36 positions shown; findings below may reference images not displayed]

FINDINGS: CT CHEST FINDINGS

Cardiovascular: No significant vascular findings. Normal heart size.
No pericardial effusion.

Mediastinum/Nodes: No enlarged mediastinal, hilar, or axillary lymph
nodes. Small hiatal hernia. Thyroid gland, trachea, and esophagus
demonstrate no significant findings.

Lungs/Pleura: Unchanged fibrotic scarring and bronchiectasis of the
medial left upper lobe and lingula (series 4, image 66). Stable,
partially calcified definitively benign nodule of the lateral
segment right middle lobe measuring 0.3 cm (series 4, image 78). No
pleural effusion or pneumothorax.

Musculoskeletal: No chest wall mass or suspicious bone lesions
identified. Status post bilateral mastectomy.

CT ABDOMEN PELVIS FINDINGS

Hepatobiliary: No focal liver abnormality is seen. Status post
cholecystectomy. No biliary dilatation.

Pancreas: Unremarkable. No pancreatic ductal dilatation or
surrounding inflammatory changes.

Spleen: Normal in size without significant abnormality.

Adrenals/Urinary Tract: Adrenal glands are unremarkable. Kidneys are
normal, without renal calculi, solid lesion, or hydronephrosis.
Bladder is unremarkable.

Stomach/Bowel: Stomach is within normal limits. Appendix is not
clearly visualized and may be surgically absent. No evidence of
bowel wall thickening, distention, or inflammatory changes. Sigmoid
diverticula.

Vascular/Lymphatic: Aortic atherosclerosis. No enlarged abdominal or
pelvic lymph nodes.

Reproductive: Status post hysterectomy.

Other: No abdominal wall hernia or abnormality. No abdominopelvic
ascites.

Musculoskeletal: No acute or significant osseous findings.
IMPRESSION: 1. No noncontrast evidence of mass, lymphadenopathy, or metastatic
disease in the chest, abdomen, or pelvis.
2. Status post bilateral mastectomy.
3. Unchanged fibrotic scarring and bronchiectasis of the medial left
upper lobe and lingula, most likely post infectious or inflammatory
given distribution and appearance.
4. Small hiatal hernia.

Aortic Atherosclerosis ([id number]).

## 2022-05-15 ENCOUNTER — Ambulatory Visit: Payer: Medicare Other | Admitting: Medical-Surgical

## 2022-05-21 ENCOUNTER — Ambulatory Visit (INDEPENDENT_AMBULATORY_CARE_PROVIDER_SITE_OTHER): Payer: Medicare Other | Admitting: Medical-Surgical

## 2022-05-21 ENCOUNTER — Encounter: Payer: Self-pay | Admitting: Medical-Surgical

## 2022-05-21 VITALS — BP 119/73 | HR 72 | Resp 20 | Ht 68.0 in | Wt 174.1 lb

## 2022-05-21 DIAGNOSIS — I471 Supraventricular tachycardia, unspecified: Secondary | ICD-10-CM | POA: Diagnosis not present

## 2022-05-21 DIAGNOSIS — J849 Interstitial pulmonary disease, unspecified: Secondary | ICD-10-CM

## 2022-05-21 NOTE — Progress Notes (Signed)
Established Patient Office Visit  Subjective   Patient ID: Courtney Cummings, female   DOB: 1941-01-29 Age: 82 y.o. MRN: [id number]   Chief Complaint  Patient presents with   Follow-up   heart concerns    HPI Pleasant 82 year old female presenting today to discuss chronic lung and heart issues. Was referred to Cardiology and Pulmonology recently but reports that she was unhappy with her care at both places.   Pulmonology: has had a long history of ILD  and is aware that this condition  is chronic. Knows that it will continue to worsen. After seeing the new Pulmonologist, she underwent further testing over the period of a month. When she returned to their office for follow up, she reports being told that with her multiple allergies and intolerances, there was really nothing for them to do to help her. She is concerned that her ILD is contributing to her current symptoms of intermittent chest discomfort with "blips" and expresses the desire to be referred somewhere else. Notes her goal is to have a specialist on board for close follow up of her disease and any concerns that may arise. Understands there is no cure.   Cardiology: long history of cardiac issues and reports that she was originally supposed to have a cardiac cath years ago but was diagnosed with cancer. The cardiac issue was less pressing so this was put on the back burner. At one time, she was  prescribed metoprolol and took the medication as instructed. Reports this left her feeling drained and unable to function normally. When she went to the new cardiologist, a long term monitor showed episodes of SVT corresponding to her symptoms. She was advised to start a low dose of Toprol-XL. She was concerned about her previous intolerance to the medication and was told to not start it after all. She reports feeling very dismissed at that appointment. Similar to her lung issues, she is aware that this is chronic but wants someone on board for close  follow up.    Objective:    Vitals:   05/21/22 1043  BP: 119/73  Pulse: 72  Resp: 20  Height: '5\' 8"'$  (1.727 m)  Weight: 174 lb 1.9 oz (79 kg)  SpO2: 96%  BMI (Calculated): 26.48    Physical Exam Vitals and nursing note reviewed.  Constitutional:      General: She is not in acute distress.    Appearance: Normal appearance. She is not ill-appearing.  HENT:     Head: Normocephalic and atraumatic.  Cardiovascular:     Rate and Rhythm: Normal rate and regular rhythm.     Pulses: Normal pulses.     Heart sounds: Normal heart sounds.  Pulmonary:     Effort: Pulmonary effort is normal. No respiratory distress.     Breath sounds: Normal breath sounds. No wheezing, rhonchi or rales.  Skin:    General: Skin is warm and dry.  Neurological:     Mental Status: She is alert and oriented to person, place, and time.  Psychiatric:        Mood and Affect: Mood normal.        Behavior: Behavior normal.        Thought Content: Thought content normal.        Judgment: Judgment normal.   No results found for this or any previous visit (from the past 24 hour(s)).     The ASCVD Risk score (Arnett DK, et al., 2019) failed to calculate for the following reasons:  The 2019 ASCVD risk score is only valid for ages 52 to 31   The patient has a prior MI or stroke diagnosis   Assessment & Plan:   1. ILD (interstitial lung disease) ([place]) Discussed her concerns. I do agree that her lung disease is contributing to some of her symptoms. Although there is no cure, placing a new referral to Pulmonology to see if we can get her in with someone willing to monitor the issue on a long term basis.  - Ambulatory referral to Pulmonology  2. SVT (supraventricular tachycardia) Likely etiology of blips/CP is SVT episodes as demonstrated on long term monitoring. Placing a new referral to Cardiology.  - Ambulatory referral to Cardiology  Return in about 6 weeks (around 07/02/2022) for chronic disease follow  up.  ___________________________________________ Clearnce Sorrel, DNP, APRN, FNP-BC [facility] and [place]

## 2022-05-22 ENCOUNTER — Other Ambulatory Visit: Payer: Self-pay | Admitting: Physician Assistant

## 2022-05-22 DIAGNOSIS — R110 Nausea: Secondary | ICD-10-CM

## 2022-05-22 NOTE — Telephone Encounter (Signed)
Joy pt 

## 2022-05-26 ENCOUNTER — Ambulatory Visit: Payer: Medicare Other | Admitting: Physician Assistant

## 2022-05-26 NOTE — Telephone Encounter (Signed)
Received referral from [facility]. Pt is wanting to switch providers from Dr Chase Caller. Pt has ILD, and no doctor was listed on this referral. Please advise if switch is okay and who you advise she see.

## 2022-05-26 NOTE — Telephone Encounter (Signed)
Dr Vaughan Browner is the ILD doc and she can see him

## 2022-05-27 NOTE — Telephone Encounter (Signed)
Ok to schedule in 30 min slot

## 2022-05-27 NOTE — Telephone Encounter (Signed)
Can we get this patient scheduled in a new patient appointment slot (30 mins) with Dr Vaughan Browner  Please and thank you

## 2022-05-27 NOTE — Telephone Encounter (Signed)
Dr Vaughan Browner,  Sir are you ok with patient switching to you in regards to ILD. From Dr Chase Caller.   Please advise sir

## 2022-05-28 NOTE — Telephone Encounter (Signed)
Called and spoke with patient.  Offered to schedule new patient 30 minute visit with Dr. Vaughan Browner.  Patient declined OV.  Patient stated she has established with pulmonary within [facility]. Advised patient to call office if she decides to continue care with [facility]. Nothing further at this time.

## 2022-05-29 ENCOUNTER — Other Ambulatory Visit: Payer: Self-pay

## 2022-05-29 ENCOUNTER — Other Ambulatory Visit: Payer: Self-pay | Admitting: Medical-Surgical

## 2022-05-29 DIAGNOSIS — J189 Pneumonia, unspecified organism: Secondary | ICD-10-CM

## 2022-05-29 DIAGNOSIS — J849 Interstitial pulmonary disease, unspecified: Secondary | ICD-10-CM

## 2022-05-29 MED ORDER — ALBUTEROL SULFATE (2.5 MG/3ML) 0.083% IN NEBU: 2.5000 mg | INHALATION_SOLUTION | RESPIRATORY_TRACT | 2 refills | Status: DC | PRN

## 2022-06-08 ENCOUNTER — Other Ambulatory Visit: Payer: Self-pay

## 2022-06-08 DIAGNOSIS — I10 Essential (primary) hypertension: Secondary | ICD-10-CM

## 2022-06-08 MED ORDER — AMLODIPINE BESYLATE 2.5 MG PO TABS: 2.5000 mg | ORAL_TABLET | Freq: Every day | ORAL | 0 refills | Status: DC

## 2022-06-11 DIAGNOSIS — Z961 Presence of intraocular lens: Secondary | ICD-10-CM | POA: Diagnosis not present

## 2022-06-11 DIAGNOSIS — H2511 Age-related nuclear cataract, right eye: Secondary | ICD-10-CM | POA: Diagnosis not present

## 2022-06-11 DIAGNOSIS — H01005 Unspecified blepharitis left lower eyelid: Secondary | ICD-10-CM | POA: Diagnosis not present

## 2022-07-02 DIAGNOSIS — Z8673 Personal history of transient ischemic attack (TIA), and cerebral infarction without residual deficits: Secondary | ICD-10-CM | POA: Diagnosis not present

## 2022-07-02 DIAGNOSIS — T887XXA Unspecified adverse effect of drug or medicament, initial encounter: Secondary | ICD-10-CM | POA: Diagnosis not present

## 2022-07-02 DIAGNOSIS — Z789 Other specified health status: Secondary | ICD-10-CM | POA: Diagnosis not present

## 2022-07-02 DIAGNOSIS — I10 Essential (primary) hypertension: Secondary | ICD-10-CM | POA: Diagnosis not present

## 2022-07-02 DIAGNOSIS — E782 Mixed hyperlipidemia: Secondary | ICD-10-CM | POA: Diagnosis not present

## 2022-07-02 DIAGNOSIS — I471 Supraventricular tachycardia, unspecified: Secondary | ICD-10-CM | POA: Diagnosis not present

## 2022-07-02 DIAGNOSIS — I447 Left bundle-branch block, unspecified: Secondary | ICD-10-CM | POA: Diagnosis not present

## 2022-07-03 ENCOUNTER — Ambulatory Visit (INDEPENDENT_AMBULATORY_CARE_PROVIDER_SITE_OTHER): Payer: Medicare Other | Admitting: Medical-Surgical

## 2022-07-03 ENCOUNTER — Encounter: Payer: Self-pay | Admitting: Medical-Surgical

## 2022-07-03 VITALS — BP 104/69 | HR 70 | Resp 20 | Ht 68.0 in | Wt 173.1 lb

## 2022-07-03 DIAGNOSIS — J849 Interstitial pulmonary disease, unspecified: Secondary | ICD-10-CM | POA: Diagnosis not present

## 2022-07-03 DIAGNOSIS — R2689 Other abnormalities of gait and mobility: Secondary | ICD-10-CM | POA: Diagnosis not present

## 2022-07-03 DIAGNOSIS — R296 Repeated falls: Secondary | ICD-10-CM

## 2022-07-03 DIAGNOSIS — I10 Essential (primary) hypertension: Secondary | ICD-10-CM | POA: Diagnosis not present

## 2022-07-03 DIAGNOSIS — I471 Supraventricular tachycardia, unspecified: Secondary | ICD-10-CM | POA: Insufficient documentation

## 2022-07-03 DIAGNOSIS — F419 Anxiety disorder, unspecified: Secondary | ICD-10-CM | POA: Diagnosis not present

## 2022-07-03 MED ORDER — ALBUTEROL SULFATE (2.5 MG/3ML) 0.083% IN NEBU: 2.5000 mg | INHALATION_SOLUTION | RESPIRATORY_TRACT | 2 refills | Status: DC | PRN

## 2022-07-03 MED ORDER — ALBUTEROL SULFATE HFA 108 (90 BASE) MCG/ACT IN AERS: 2.0000 | INHALATION_SPRAY | RESPIRATORY_TRACT | 5 refills | Status: DC | PRN

## 2022-07-03 NOTE — Progress Notes (Signed)
Established Patient Office Visit  Subjective   Patient ID: Darienne Baus, female   DOB: [birth year] Age: 82 y.o. MRN: [id number]   Chief Complaint  Patient presents with   Follow-up   HPI Pleasant 82 year old female accompanied by her daughter and care assistant presenting today for a chronic disease follow up.   ILD: using an albuterol inhaler or nebulizer as needed with good effectiveness. No worsening shortness of breath or chest pain. Followed by Pulmonology.  HTN: saw cardiology yesterday for follow up. Currently taking Amlodipine 2.48m daily, tolerating well without side effects. Will be starting Zetia 132mdaily at the urging of the Cardiologist. Monitoring BP at home and reports readings are at or below 130/80. Denies CP, SOB, palpitations, lower extremity edema, dizziness, headaches, or vision changes.  Mood: taking sertraline 5020maily, tolerating well without side effects. Continues to use Ativan 1mg43mice daily as needed. Denies SI/HI.    Reports another fall recently. Has gotten progressively weaker and is having a hard time safely ambulating. Using her rolling walker but is still unstable. Her caregivers are not letting her ambulate by herself. Her daughter reports that she did not give good effort with her previous home health physical therapy and is requesting a new referral. The patient notes she is willing to give it a good effort this time with hopes of regaining some independence.   Objective:    Vitals:   07/03/22 1045  BP: 104/69  Pulse: 70  Resp: 20  Height: 5' 8"$  (1.727 m)  Weight: 173 lb 1.9 oz (78.5 kg)  SpO2: 97%  BMI (Calculated): 26.33    Physical Exam Vitals reviewed.  Constitutional:      General: She is not in acute distress.    Appearance: Normal appearance. She is not ill-appearing.  HENT:     Head: Normocephalic and atraumatic.  Cardiovascular:     Rate and Rhythm: Normal rate and regular rhythm.     Pulses: Normal pulses.     Heart  sounds: Normal heart sounds.  Pulmonary:     Effort: Pulmonary effort is normal. No respiratory distress.     Breath sounds: Normal breath sounds. No wheezing, rhonchi or rales.  Skin:    General: Skin is warm and dry.     Coloration: Skin is pale.  Neurological:     Mental Status: She is alert and oriented to person, place, and time.     Motor: Weakness (generalized) present.     Gait: Gait abnormal (unsteady).  Psychiatric:        Mood and Affect: Mood normal.        Behavior: Behavior normal.        Thought Content: Thought content normal.        Judgment: Judgment normal.    No results found for this or any previous visit (from the past 24 hour(s)).     The ASCVD Risk score (Arnett DK, et al., 2019) failed to calculate for the following reasons:   The 2019 ASCVD risk score is only valid for ages 40 t4579  30he patient has a prior MI or stroke diagnosis   Assessment & Plan:   1. ILD (interstitial lung disease) (HCC) Continue Albuterol as needed.  - albuterol (PROVENTIL) (2.5 MG/3ML) 0.083% nebulizer solution; Take 3 mLs (2.5 mg total) by nebulization every 4 (four) hours as needed for wheezing or shortness of breath (please include nebulizer machine, hoses, and mask if needed.).  Dispense: 30 mL; Refill: 2 -  albuterol (VENTOLIN HFA) 108 (90 Base) MCG/ACT inhaler; Inhale 2 puffs into the lungs every 4 (four) hours as needed for wheezing or shortness of breath.  Dispense: 18 g; Refill: 5  2. Essential hypertension Managed by cardiology. Stable. Monitor for orthostasis. Change positions slowly.   3. Chronic anxiety Stable. Managed by psychiatry.   4. Balance problem 5. Recurrent falls Referring to home health for PT. Discussed possible addition of OT due to hand tremors making self care activities difficult. She will let me know if she thinks this would help.  - Ambulatory referral to [facility]  Return in about 6 months (around 01/01/2023) for chronic disease follow  up.  ___________________________________________ Clearnce Sorrel, DNP, APRN, FNP-BC Primary Care and [facility]

## 2022-07-20 DIAGNOSIS — K869 Disease of pancreas, unspecified: Secondary | ICD-10-CM | POA: Diagnosis not present

## 2022-07-20 DIAGNOSIS — K219 Gastro-esophageal reflux disease without esophagitis: Secondary | ICD-10-CM | POA: Diagnosis not present

## 2022-07-20 DIAGNOSIS — K5900 Constipation, unspecified: Secondary | ICD-10-CM | POA: Diagnosis not present

## 2022-07-23 ENCOUNTER — Other Ambulatory Visit: Payer: Self-pay

## 2022-07-23 DIAGNOSIS — I10 Essential (primary) hypertension: Secondary | ICD-10-CM

## 2022-07-23 MED ORDER — AMLODIPINE BESYLATE 2.5 MG PO TABS: 2.5000 mg | ORAL_TABLET | Freq: Every day | ORAL | 0 refills | Status: DC

## 2022-08-19 ENCOUNTER — Telehealth: Payer: Self-pay | Admitting: Medical-Surgical

## 2022-08-19 NOTE — Telephone Encounter (Signed)
Contacted Cortni Turay to schedule their annual wellness visit. Patient declined to schedule AWV at this time. Pt has visit set up with [facility]  *(Trudi Ida)*

## 2022-08-21 ENCOUNTER — Other Ambulatory Visit: Payer: Self-pay | Admitting: Medical-Surgical

## 2022-08-21 DIAGNOSIS — I10 Essential (primary) hypertension: Secondary | ICD-10-CM

## 2022-09-22 DIAGNOSIS — C50919 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: Secondary | ICD-10-CM | POA: Diagnosis not present

## 2022-09-22 DIAGNOSIS — J849 Interstitial pulmonary disease, unspecified: Secondary | ICD-10-CM | POA: Diagnosis not present

## 2022-09-22 DIAGNOSIS — J4530 Mild persistent asthma, uncomplicated: Secondary | ICD-10-CM | POA: Diagnosis not present

## 2022-09-22 DIAGNOSIS — J479 Bronchiectasis, uncomplicated: Secondary | ICD-10-CM | POA: Diagnosis not present

## 2022-09-22 DIAGNOSIS — R918 Other nonspecific abnormal finding of lung field: Secondary | ICD-10-CM | POA: Diagnosis not present

## 2022-09-28 DIAGNOSIS — J849 Interstitial pulmonary disease, unspecified: Secondary | ICD-10-CM | POA: Diagnosis not present

## 2022-10-01 ENCOUNTER — Other Ambulatory Visit: Payer: Self-pay | Admitting: Medical-Surgical

## 2022-10-01 DIAGNOSIS — I10 Essential (primary) hypertension: Secondary | ICD-10-CM

## 2022-11-05 ENCOUNTER — Other Ambulatory Visit: Payer: Self-pay | Admitting: Medical-Surgical

## 2022-11-05 DIAGNOSIS — I447 Left bundle-branch block, unspecified: Secondary | ICD-10-CM | POA: Diagnosis not present

## 2022-11-05 DIAGNOSIS — R296 Repeated falls: Secondary | ICD-10-CM | POA: Diagnosis not present

## 2022-11-05 DIAGNOSIS — Z889 Allergy status to unspecified drugs, medicaments and biological substances status: Secondary | ICD-10-CM | POA: Diagnosis not present

## 2022-11-05 DIAGNOSIS — I471 Supraventricular tachycardia, unspecified: Secondary | ICD-10-CM | POA: Diagnosis not present

## 2022-11-05 DIAGNOSIS — G459 Transient cerebral ischemic attack, unspecified: Secondary | ICD-10-CM | POA: Diagnosis not present

## 2022-11-05 DIAGNOSIS — Z789 Other specified health status: Secondary | ICD-10-CM | POA: Diagnosis not present

## 2022-11-05 DIAGNOSIS — J849 Interstitial pulmonary disease, unspecified: Secondary | ICD-10-CM | POA: Diagnosis not present

## 2022-11-05 DIAGNOSIS — T887XXA Unspecified adverse effect of drug or medicament, initial encounter: Secondary | ICD-10-CM | POA: Diagnosis not present

## 2022-11-05 DIAGNOSIS — I10 Essential (primary) hypertension: Secondary | ICD-10-CM | POA: Diagnosis not present

## 2022-11-05 DIAGNOSIS — R918 Other nonspecific abnormal finding of lung field: Secondary | ICD-10-CM | POA: Diagnosis not present

## 2022-11-20 ENCOUNTER — Encounter (HOSPITAL_BASED_OUTPATIENT_CLINIC_OR_DEPARTMENT_OTHER): Payer: Self-pay

## 2022-11-20 ENCOUNTER — Emergency Department (HOSPITAL_BASED_OUTPATIENT_CLINIC_OR_DEPARTMENT_OTHER)
Admission: EM | Admit: 2022-11-20 | Discharge: 2022-11-20 | Disposition: A | Discharge status: Home / Self Care | Payer: Medicare Other | Attending: Emergency Medicine | Admitting: Emergency Medicine

## 2022-11-20 ENCOUNTER — Emergency Department (HOSPITAL_BASED_OUTPATIENT_CLINIC_OR_DEPARTMENT_OTHER): Payer: Medicare Other

## 2022-11-20 ENCOUNTER — Other Ambulatory Visit: Payer: Self-pay

## 2022-11-20 DIAGNOSIS — R0602 Shortness of breath: Secondary | ICD-10-CM | POA: Diagnosis not present

## 2022-11-20 DIAGNOSIS — Z91040 Latex allergy status: Secondary | ICD-10-CM | POA: Diagnosis not present

## 2022-11-20 DIAGNOSIS — Z20822 Contact with and (suspected) exposure to covid-19: Secondary | ICD-10-CM | POA: Diagnosis not present

## 2022-11-20 DIAGNOSIS — J209 Acute bronchitis, unspecified: Secondary | ICD-10-CM | POA: Insufficient documentation

## 2022-11-20 DIAGNOSIS — R918 Other nonspecific abnormal finding of lung field: Secondary | ICD-10-CM | POA: Diagnosis not present

## 2022-11-20 DIAGNOSIS — R059 Cough, unspecified: Secondary | ICD-10-CM | POA: Diagnosis not present

## 2022-11-20 LAB — CBC WITH DIFFERENTIAL/PLATELET
Abs Immature Granulocytes: 0.03 10*3/uL (ref 0.00–0.07)
Basophils Absolute: 0.1 10*3/uL (ref 0.0–0.1)
Basophils Relative: 1 %
Eosinophils Absolute: 0.2 10*3/uL (ref 0.0–0.5)
Eosinophils Relative: 2 %
HCT: 36.6 % (ref 36.0–46.0)
Hemoglobin: 12.2 g/dL (ref 12.0–15.0)
Immature Granulocytes: 0 %
Lymphocytes Relative: 23 %
Lymphs Abs: 1.6 10*3/uL (ref 0.7–4.0)
MCH: 30.2 pg (ref 26.0–34.0)
MCHC: 33.3 g/dL (ref 30.0–36.0)
MCV: 90.6 fL (ref 80.0–100.0)
Monocytes Absolute: 0.4 10*3/uL (ref 0.1–1.0)
Monocytes Relative: 6 %
Neutro Abs: 4.7 10*3/uL (ref 1.7–7.7)
Neutrophils Relative %: 68 %
Platelets: 221 10*3/uL (ref 150–400)
RBC: 4.04 MIL/uL (ref 3.87–5.11)
RDW: 13.4 % (ref 11.5–15.5)
WBC: 6.9 10*3/uL (ref 4.0–10.5)
nRBC: 0 % (ref 0.0–0.2)

## 2022-11-20 LAB — BASIC METABOLIC PANEL
Anion gap: 8 (ref 5–15)
BUN: 15 mg/dL (ref 8–23)
CO2: 23 mmol/L (ref 22–32)
Calcium: 8.4 mg/dL — ABNORMAL LOW (ref 8.9–10.3)
Chloride: 105 mmol/L (ref 98–111)
Creatinine, Ser: 0.76 mg/dL (ref 0.44–1.00)
GFR, Estimated: >60 mL/min (ref >60)
Glucose, Bld: 143 mg/dL — ABNORMAL HIGH (ref 70–99)
Potassium: 3.7 mmol/L (ref 3.5–5.1)
Sodium: 136 mmol/L (ref 135–145)

## 2022-11-20 LAB — SARS CORONAVIRUS 2 BY RT PCR: SARS Coronavirus 2 by RT PCR: NEGATIVE

## 2022-11-20 LAB — LACTIC ACID, PLASMA: Lactic Acid, Venous: 1.5 mmol/L (ref 0.5–1.9)

## 2022-11-20 LAB — CULTURE, BLOOD (ROUTINE X 2): Special Requests: ADEQUATE

## 2022-11-20 MED ORDER — AZITHROMYCIN 500 MG PO TABS: 500.0000 mg | ORAL_TABLET | Freq: Every day | ORAL | 0 refills | Status: DC

## 2022-11-20 NOTE — ED Notes (Signed)
1st lactic acid drawn from from right wrist site 2nd lactic drawn from left lower arm

## 2022-11-20 NOTE — Discharge Instructions (Addendum)
You were seen in the department for worsening cough and shortness of breath.  You had blood work chest x-ray and COVID testing.  Please increase your Zithromax to 500 mg daily for 3 days.  Call your pulmonologist for follow-up.  Return to the emergency department if any worsening or concerning symptoms.

## 2022-11-20 NOTE — ED Provider Notes (Signed)
[place] [facility]ERGENCY DEPARTMENT AT [facility] Provider Note   CSN: [id number] Arrival date & time: 11/20/22  1023     History {Add pertinent medical, surgical, social history, OB history to HPI:1} Chief Complaint  Patient presents with   Cough    Lexandra Roccia is a 82 y.o. female.  She has a history of interstitial lung disease and is on chronic suppressive treatment with Zithromax.  She is complaining of worsening cough productive of some sputum that is been going on for about 10 days.  Associated with chills.  She has been trying her chest percussion and her nebulizers without improvement.  She has been unable to see her pulmonologist.  She said she has a lot of allergies and so they usually just put her on a larger course of Zithromax.  She said she cannot do steroids  The history is provided by the patient.  Cough Cough characteristics:  Productive Sputum characteristics:  Unable to specify Severity:  Moderate Onset quality:  Gradual Duration:  10 days Timing:  Intermittent Progression:  Unchanged Chronicity:  Recurrent Relieved by:  Nothing Worsened by:  Activity Ineffective treatments:  Home nebulizer Associated symptoms: chills and shortness of breath   Associated symptoms: no chest pain and no fever        Home Medications Prior to Admission medications   Medication Sig Start Date End Date Taking? Authorizing Provider  acetaminophen (TYLENOL) 325 MG tablet Take 650 mg by mouth every 6 (six) hours as needed for pain or headache.    [provider]  albuterol (PROVENTIL) (2.5 MG/3ML) 0.083% nebulizer solution Take 3 mLs (2.5 mg total) by nebulization every 4 (four) hours as needed for wheezing or shortness of breath (please include nebulizer machine, hoses, and mask if needed.). 07/03/22   Christen Butter, NP  albuterol (VENTOLIN HFA) 108 (90 Base) MCG/ACT inhaler Inhale 2 puffs into the lungs every 4 (four) hours as needed for wheezing or shortness of  breath. 07/03/22   Christen Butter, NP  amLODipine (NORVASC) 2.5 MG tablet Take 1 tablet by mouth once daily 11/05/22   Christen Butter, NP  diclofenac Sodium (VOLTAREN) 1 % GEL Apply 4 g topically 4 (four) times daily. To affected joint. 03/13/22   Breeback, Jade L, PA-C  docusate sodium (COLACE) 100 MG capsule Take 100 mg by mouth daily as needed for mild constipation.    [provider]  ibuprofen (ADVIL) 200 MG tablet Take 200 mg by mouth every 6 (six) hours as needed. 1-2 tabs prn    [provider]  LORazepam (ATIVAN) 1 MG tablet Take 1 tablet (1 mg total) by mouth 2 (two) times daily as needed (Use very sparingly). 08/23/20   Christen Butter, NP  meclizine (ANTIVERT) 25 MG tablet Take 1 tablet (25 mg total) by mouth 3 (three) times daily as needed for dizziness or nausea. 09/24/20   Christen Butter, NP  Multiple Vitamin (MULTI-VITAMIN) tablet Take 1 tablet by mouth daily.    [provider]  omeprazole (PRILOSEC) 40 MG capsule TAKE 1 CAPSULE(40 MG) BY MOUTH DAILY 03/17/21   Christen Butter, NP  ondansetron (ZOFRAN-ODT) 8 MG disintegrating tablet DISSOLVE 1 TABLET IN MOUTH EVERY 8 HOURS AS NEEDED FOR NAUSEA 05/25/22   Christen Butter, NP  sertraline (ZOLOFT) 50 MG tablet Take 50mg  (1 tablet) every morning and 25mg  (1/2 tablet) every evening for 1 week then increase to 50mg  twice daily. 12/25/20   Christen Butter, NP  traMADol (ULTRAM) 50 MG tablet Take 1  tablet (50 mg total) by mouth every 6 (six) hours as needed. 09/12/21   Christen Butter, NP      Allergies    Diltiazem, Iodinated contrast media, Iodine, Iodine-131, Levalbuterol hcl, Meperidine hcl, Mometasone, Quinolones, Tape, Tiotropium, Umeclidinium, Asmanex (120 metered doses) [mometasone furoate], Azithromycin, Elemental sulfur, Statins, Sulfa antibiotics, Sulfasalazine, Valacyclovir, Celebrex [celecoxib], Codeine, Diltiazem hcl, Fluticasone furoate-vilanterol, Latex, Lisinopril, and Pantoprazole    Review of Systems   Review of Systems   Constitutional:  Positive for chills. Negative for fever.  Respiratory:  Positive for cough and shortness of breath.   Cardiovascular:  Negative for chest pain.    Physical Exam Updated Vital Signs BP 136/73   Pulse 62   Temp 98.4 F (36.9 C) (Oral)   Resp 16   Ht 5\' 8"  (1.727 m)   Wt 77.1 kg   SpO2 97%   BMI 25.85 kg/m  Physical Exam Vitals and nursing note reviewed.  Constitutional:      General: She is not in acute distress.    Appearance: Normal appearance. She is well-developed.  HENT:     Head: Normocephalic and atraumatic.  Eyes:     Conjunctiva/sclera: Conjunctivae normal.  Cardiovascular:     Rate and Rhythm: Normal rate and regular rhythm.     Heart sounds: No murmur heard. Pulmonary:     Effort: Pulmonary effort is normal. No respiratory distress.     Breath sounds: Rhonchi present.  Abdominal:     Palpations: Abdomen is soft.     Tenderness: There is no abdominal tenderness. There is no guarding or rebound.  Musculoskeletal:        General: No deformity. Normal range of motion.     Cervical back: Neck supple.  Skin:    General: Skin is warm and dry.     Capillary Refill: Capillary refill takes less than 2 seconds.  Neurological:     General: No focal deficit present.     Mental Status: She is alert.     ED Results / Procedures / Treatments   Labs (all labs ordered are listed, but only abnormal results are displayed) Labs Reviewed - No data to display  EKG EKG Interpretation Date/Time:  Friday November 20 2022 10:53:08 EDT Ventricular Rate:  60 PR Interval:  203 QRS Duration:  163 QT Interval:  495 QTC Calculation: 495 R Axis:   -59  Text Interpretation: Sinus rhythm Left bundle branch block No significant change since prior 5/23 Confirmed by Meridee Score [phone]) on 11/20/2022 10:59:28 AM  Radiology No results found.  Procedures Procedures  {Document cardiac monitor, telemetry assessment procedure when appropriate:1}  Medications  Ordered in ED Medications - No data to display  ED Course/ Medical Decision Making/ A&P Clinical Course as of 11/20/22 1059  Fri Nov 20, 2022  1051 Chest x-ray interpreted by me as no clear infiltrate.  Awaiting radiology reading. [MB]    Clinical Course User Index [MB] Terrilee Files, MD   {   Click here for ABCD2, HEART and other calculatorsREFRESH Note before signing :1}                          Medical Decision Making Amount and/or Complexity of Data Reviewed Labs: ordered. Radiology: ordered.   This patient complains of ***; this involves an extensive number of treatment Options and is a complaint that carries with it a high risk of complications and morbidity. The differential includes ***  I ordered, reviewed  and interpreted labs, which included *** I ordered medication *** and reviewed PMP when indicated. I ordered imaging studies which included *** and I independently    visualized and interpreted imaging which showed *** Additional history obtained from *** Previous records obtained and reviewed *** I consulted *** and discussed lab and imaging findings and discussed disposition.  Cardiac monitoring reviewed, *** Social determinants considered, *** Critical Interventions: ***  After the interventions stated above, I reevaluated the patient and found *** Admission and further testing considered, ***   {Document critical care time when appropriate:1} {Document review of labs and clinical decision tools ie heart score, Chads2Vasc2 etc:1}  {Document your independent review of radiology images, and any outside records:1} {Document your discussion with family members, caretakers, and with consultants:1} {Document social determinants of health affecting pt's care:1} {Document your decision making why or why not admission, treatments were needed:1} Final Clinical Impression(s) / ED Diagnoses Final diagnoses:  None    Rx / DC Orders ED Discharge Orders      None

## 2022-11-20 NOTE — ED Triage Notes (Addendum)
Pt arrived POV with family. Reports cough x 10 days. Non productive cough. Feels shaky and weak. Has been using nebs. Pt reports taking azithromycin every other day for lung disease

## 2022-11-21 LAB — CULTURE, BLOOD (ROUTINE X 2): Special Requests: ADEQUATE

## 2022-11-22 LAB — CULTURE, BLOOD (ROUTINE X 2): Culture: NO GROWTH

## 2022-11-23 ENCOUNTER — Ambulatory Visit (INDEPENDENT_AMBULATORY_CARE_PROVIDER_SITE_OTHER): Payer: Medicare Other | Admitting: Medical-Surgical

## 2022-11-23 ENCOUNTER — Encounter: Payer: Self-pay | Admitting: Medical-Surgical

## 2022-11-23 VITALS — BP 98/64 | HR 72 | Resp 20 | Ht 68.0 in | Wt 171.0 lb

## 2022-11-23 DIAGNOSIS — B349 Viral infection, unspecified: Secondary | ICD-10-CM

## 2022-11-23 DIAGNOSIS — R0602 Shortness of breath: Secondary | ICD-10-CM

## 2022-11-23 LAB — CULTURE, BLOOD (ROUTINE X 2)

## 2022-11-23 MED ORDER — GUAIFENESIN ER 600 MG PO TB12: 600.0000 mg | ORAL_TABLET | Freq: Two times a day (BID) | ORAL | 0 refills | Status: DC

## 2022-11-23 NOTE — Progress Notes (Signed)
        Established patient visit  History, exam, impression, and plan:  1. Shortness of breath 2. Viral illness Pleasant 82 year old female accompanied by her daughter and her caregiver presenting today with complaints of recent respiratory illness causing severe shortness of breath, generalized malaise, fatigue, and progressive cough.  She was evaluated in the ED and treated with azithromycin 500 mg daily x 3 days to cover for atypical pneumonia.  She has a pulmonologist however reports that she is unable to get acute visits with that provider and is told to go to the emergency room for further evaluation.  Vital signs are stable today.  See below for physical exam.  No testing was completed at the ED outside of COVID evaluation which was negative.  Suspect that she may have been exposed to metapneumovirus since this is prevalent in the community right now.  Ultimately, recommend she finish azithromycin as prescribed.  Continue using prescribed inhalers.  Start Mucinex 600 mg twice daily.  Push fluids and make sure to eat small frequent meals.  Plan to reach out to pulmonology for an ASAP appointment to discuss symptoms.  Holding off on the addition of other medications today due to her extensive intolerance/allergy list.  Physical Exam Vitals reviewed.  Constitutional:      General: She is not in acute distress.    Appearance: Normal appearance. She is ill-appearing.  HENT:     Head: Normocephalic and atraumatic.  Cardiovascular:     Rate and Rhythm: Normal rate and regular rhythm.     Pulses: Normal pulses.     Heart sounds: Normal heart sounds. No murmur heard.    No friction rub. No gallop.  Pulmonary:     Effort: Pulmonary effort is normal. No respiratory distress.     Breath sounds: Normal breath sounds. No wheezing.  Skin:    General: Skin is warm and dry.  Neurological:     Mental Status: She is alert and oriented to person, place, and time.  Psychiatric:        Mood and  Affect: Mood normal.        Behavior: Behavior normal.        Thought Content: Thought content normal.        Judgment: Judgment normal.    Procedures performed this visit: None.  Return if symptoms worsen or fail to improve.  __________________________________ Thayer Ohm, DNP, APRN, FNP-BC Primary Care and Sports Medicine [facility] [place]

## 2022-11-25 LAB — CULTURE, BLOOD (ROUTINE X 2)

## 2022-12-04 ENCOUNTER — Telehealth: Payer: Self-pay | Admitting: Medical-Surgical

## 2022-12-04 NOTE — Telephone Encounter (Signed)
Contacted patient's daughter Idalia Needle). Christen Butter spoke with Idalia Needle with her recommendations.

## 2022-12-04 NOTE — Telephone Encounter (Signed)
Pt called. Mom was told by pcp if she was no better to have her go the ER. Mom is dizzy almost fell on her way to the bathroom. Daughter is concerned that ED is going to send her back home when Ander Slade said she needs to be admitted.  She wants to know if Ander Slade can get the ball rolling so that her mom can be admitted.

## 2022-12-07 ENCOUNTER — Ambulatory Visit: Payer: Medicare Other | Admitting: Medical-Surgical

## 2022-12-08 ENCOUNTER — Other Ambulatory Visit: Payer: Self-pay | Admitting: Medical-Surgical

## 2022-12-08 DIAGNOSIS — I10 Essential (primary) hypertension: Secondary | ICD-10-CM

## 2023-01-01 ENCOUNTER — Encounter: Payer: Self-pay | Admitting: Medical-Surgical

## 2023-01-01 ENCOUNTER — Ambulatory Visit (INDEPENDENT_AMBULATORY_CARE_PROVIDER_SITE_OTHER): Payer: Medicare Other | Admitting: Medical-Surgical

## 2023-01-01 VITALS — BP 118/77 | HR 67 | Ht 67.0 in | Wt 167.1 lb

## 2023-01-01 DIAGNOSIS — C50011 Malignant neoplasm of nipple and areola, right female breast: Secondary | ICD-10-CM | POA: Diagnosis not present

## 2023-01-01 DIAGNOSIS — F411 Generalized anxiety disorder: Secondary | ICD-10-CM

## 2023-01-01 DIAGNOSIS — F3341 Major depressive disorder, recurrent, in partial remission: Secondary | ICD-10-CM

## 2023-01-01 DIAGNOSIS — J849 Interstitial pulmonary disease, unspecified: Secondary | ICD-10-CM

## 2023-01-01 DIAGNOSIS — I10 Essential (primary) hypertension: Secondary | ICD-10-CM | POA: Diagnosis not present

## 2023-01-01 DIAGNOSIS — J449 Chronic obstructive pulmonary disease, unspecified: Secondary | ICD-10-CM | POA: Diagnosis not present

## 2023-01-01 NOTE — Progress Notes (Unsigned)
        Established patient visit  History, exam, impression, and plan:  1. Essential hypertension Pleasant 82 year old female presenting today with a history of HTN currently treated with Amlodipine 2.5mg  daily. Tolerating the medication well without side effects. Denies CP, SOB, palpitations, lower extremity edema, dizziness, headaches, or vision changes. Cardiopulmonary exam normal today. Managed by Cardiology.   2. Chronic obstructive pulmonary disease, unspecified COPD type (HCC) 3. ILD (interstitial lung disease) (HCC) Managed by pulmonology. Recent exacerbation of symptoms has fully resolved and patient reports that she is feeling well. No further issues with SOB.   4. 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 Monitoring discontinued in 2022.   5. Recurrent major depressive disorder, in partial remission (HCC) 6. Generalized anxiety disorder Managed by psychiatry with Dr. Sandria Manly. Feels that her medications are working well.    Procedures performed this visit: None.  Return in about 6 months (around 07/04/2023) for chronic disease follow up.  __________________________________ Thayer Ohm, DNP, APRN, FNP-BC Primary Care and Sports Medicine [facility]

## 2023-01-06 ENCOUNTER — Other Ambulatory Visit: Payer: Self-pay | Admitting: Medical-Surgical

## 2023-01-06 DIAGNOSIS — I10 Essential (primary) hypertension: Secondary | ICD-10-CM

## 2023-02-02 DIAGNOSIS — G4489 Other headache syndrome: Secondary | ICD-10-CM | POA: Diagnosis not present

## 2023-02-02 DIAGNOSIS — I454 Nonspecific intraventricular block: Secondary | ICD-10-CM | POA: Diagnosis not present

## 2023-02-02 DIAGNOSIS — Z7982 Long term (current) use of aspirin: Secondary | ICD-10-CM | POA: Diagnosis not present

## 2023-02-02 DIAGNOSIS — E785 Hyperlipidemia, unspecified: Secondary | ICD-10-CM | POA: Diagnosis not present

## 2023-02-02 DIAGNOSIS — Z743 Need for continuous supervision: Secondary | ICD-10-CM | POA: Diagnosis not present

## 2023-02-02 DIAGNOSIS — I10 Essential (primary) hypertension: Secondary | ICD-10-CM | POA: Diagnosis not present

## 2023-02-02 DIAGNOSIS — Z87891 Personal history of nicotine dependence: Secondary | ICD-10-CM | POA: Diagnosis not present

## 2023-02-02 DIAGNOSIS — R531 Weakness: Secondary | ICD-10-CM | POA: Diagnosis not present

## 2023-02-02 DIAGNOSIS — Z8673 Personal history of transient ischemic attack (TIA), and cerebral infarction without residual deficits: Secondary | ICD-10-CM | POA: Diagnosis not present

## 2023-02-02 DIAGNOSIS — Z20822 Contact with and (suspected) exposure to covid-19: Secondary | ICD-10-CM | POA: Diagnosis not present

## 2023-02-02 DIAGNOSIS — R9089 Other abnormal findings on diagnostic imaging of central nervous system: Secondary | ICD-10-CM | POA: Diagnosis not present

## 2023-02-02 DIAGNOSIS — R519 Headache, unspecified: Secondary | ICD-10-CM | POA: Diagnosis not present

## 2023-02-02 DIAGNOSIS — R42 Dizziness and giddiness: Secondary | ICD-10-CM | POA: Diagnosis not present

## 2023-02-02 DIAGNOSIS — R001 Bradycardia, unspecified: Secondary | ICD-10-CM | POA: Diagnosis not present

## 2023-02-02 DIAGNOSIS — R9389 Abnormal findings on diagnostic imaging of other specified body structures: Secondary | ICD-10-CM | POA: Diagnosis not present

## 2023-02-02 DIAGNOSIS — I447 Left bundle-branch block, unspecified: Secondary | ICD-10-CM | POA: Diagnosis not present

## 2023-02-02 DIAGNOSIS — R29818 Other symptoms and signs involving the nervous system: Secondary | ICD-10-CM | POA: Diagnosis not present

## 2023-02-02 DIAGNOSIS — Z79899 Other long term (current) drug therapy: Secondary | ICD-10-CM | POA: Diagnosis not present

## 2023-02-02 DIAGNOSIS — I4891 Unspecified atrial fibrillation: Secondary | ICD-10-CM | POA: Diagnosis not present

## 2023-02-02 DIAGNOSIS — I440 Atrioventricular block, first degree: Secondary | ICD-10-CM | POA: Diagnosis not present

## 2023-02-03 DIAGNOSIS — R42 Dizziness and giddiness: Secondary | ICD-10-CM | POA: Diagnosis not present

## 2023-02-16 ENCOUNTER — Ambulatory Visit (INDEPENDENT_AMBULATORY_CARE_PROVIDER_SITE_OTHER): Payer: Medicare Other | Admitting: Medical-Surgical

## 2023-02-16 ENCOUNTER — Ambulatory Visit: Payer: Medicare Other | Admitting: Medical-Surgical

## 2023-02-16 ENCOUNTER — Encounter: Payer: Self-pay | Admitting: Medical-Surgical

## 2023-02-16 VITALS — BP 117/74 | HR 72 | Resp 20 | Ht 67.0 in | Wt 168.0 lb

## 2023-02-16 DIAGNOSIS — Z09 Encounter for follow-up examination after completed treatment for conditions other than malignant neoplasm: Secondary | ICD-10-CM

## 2023-02-16 DIAGNOSIS — R222 Localized swelling, mass and lump, trunk: Secondary | ICD-10-CM | POA: Diagnosis not present

## 2023-02-16 DIAGNOSIS — Z23 Encounter for immunization: Secondary | ICD-10-CM | POA: Diagnosis not present

## 2023-02-16 MED ORDER — MECLIZINE HCL 25 MG PO TABS: 25.0000 mg | ORAL_TABLET | Freq: Three times a day (TID) | ORAL | 3 refills | Status: AC | PRN

## 2023-02-16 NOTE — Progress Notes (Signed)
        Established patient visit  History, exam, impression, and plan:  1. Hospital discharge follow-up Pleasant 82 year old female presenting today for a hospital discharge follow up. She reports developing disorientation after taking a nap on 02/01/23. She was not oriented to time. Did not recognize her daughter. She did not immediately go to the ED but around 4am the next morning, she had a fall. After that, she was taken to the ED where she underwent imaging, labs, and fluids/meds. No acute findings to explain her symptoms and her neuro status gradually improved. Suspect a repeat TIA as she has had several in the past. She continues to have profound dizziness but has had no further falls. On discharge, she was referred to [facility] OT and PT. They have been in contact with a company out of [place] for this but they are waiting on paperwork from the hospital to get everything arranged. Advised them to let me know what company and I can send whatever is needed. Sending meclizine for dizziness as this has been helpful in the past.   2. Mass of left chest wall History of breast cancer with left mastectomy. Has an area along the left ribs and midaxillary line that has become swollen and is quite uncomfortable. It has done so in the past temporarily but never to this degree. She is worried that this may be something related to her previous cancer. On palpation, the area is notable for soft tissue swelling but there is a palpable firmness in the center of the concerning area without clear borders. Ordering US of the soft tissues for further evaluation.  - [place] CHEST SOFT TISSUE; Future  3. Need for influenza vaccination Flu vaccine given in office today.  - Flu Vaccine Trivalent High Dose (Fluad)  Procedures performed this visit: None.  Return if symptoms worsen or fail to improve.  __________________________________ Thayer Ohm, DNP, APRN, FNP-BC Primary Care and Sports Medicine [facility] [place]

## 2023-03-09 ENCOUNTER — Other Ambulatory Visit: Payer: Self-pay | Admitting: Medical-Surgical

## 2023-03-09 DIAGNOSIS — I10 Essential (primary) hypertension: Secondary | ICD-10-CM

## 2023-03-22 ENCOUNTER — Telehealth: Payer: Self-pay | Admitting: Medical-Surgical

## 2023-03-22 DIAGNOSIS — R222 Localized swelling, mass and lump, trunk: Secondary | ICD-10-CM

## 2023-03-22 NOTE — Telephone Encounter (Addendum)
You may contact Brandy or her Manager at [phone] (ext for manager [phone] up until 1:00 tomorrow

## 2023-03-22 NOTE — Telephone Encounter (Addendum)
Brandy with [facility] called regarding Ultrasound of chest soft tissue. Need clarification to determine location. Does Order need to changed or have another Order added. Gearldine Bienenstock can be reached at [phone] press option 1 and then option 5

## 2023-03-22 NOTE — Telephone Encounter (Signed)
The ultrasound of the chest soft tissue is along the left chest from clavicle to lowest ribs extending to the left mid axillary line and axilla.

## 2023-03-23 NOTE — Telephone Encounter (Signed)
Contacted Courtney Cummings at [employer]. They just needed clarification if the order was for breast cancer since she had previous breast cancer on the left side. Since the ultrasound is not to scan for breast cancer they are needing a sperate order for Ultrasound for the Left mid axillar.  They need this information to get the orders to the correct department.

## 2023-03-24 ENCOUNTER — Telehealth: Payer: Self-pay | Admitting: Medical-Surgical

## 2023-03-24 NOTE — Telephone Encounter (Signed)
Error

## 2023-03-29 ENCOUNTER — Ambulatory Visit
Admission: RE | Admit: 2023-03-29 | Discharge: 2023-03-29 | Disposition: A | Discharge status: Home / Self Care | Payer: Medicare Other | Source: Ambulatory Visit | Attending: Medical-Surgical | Admitting: Medical-Surgical

## 2023-03-29 DIAGNOSIS — R222 Localized swelling, mass and lump, trunk: Secondary | ICD-10-CM

## 2023-04-05 ENCOUNTER — Other Ambulatory Visit: Payer: Self-pay | Admitting: Medical-Surgical

## 2023-04-05 DIAGNOSIS — I10 Essential (primary) hypertension: Secondary | ICD-10-CM

## 2023-04-08 ENCOUNTER — Other Ambulatory Visit: Payer: Self-pay | Admitting: Medical-Surgical

## 2023-04-08 ENCOUNTER — Ambulatory Visit
Admission: RE | Admit: 2023-04-08 | Discharge: 2023-04-08 | Disposition: A | Discharge status: Home / Self Care | Payer: Medicare Other | Source: Ambulatory Visit | Attending: Medical-Surgical | Admitting: Medical-Surgical

## 2023-04-08 DIAGNOSIS — M7989 Other specified soft tissue disorders: Secondary | ICD-10-CM

## 2023-04-08 DIAGNOSIS — M79622 Pain in left upper arm: Secondary | ICD-10-CM

## 2023-04-08 DIAGNOSIS — R222 Localized swelling, mass and lump, trunk: Secondary | ICD-10-CM

## 2023-05-08 ENCOUNTER — Other Ambulatory Visit: Payer: Self-pay | Admitting: Medical-Surgical

## 2023-05-08 DIAGNOSIS — I10 Essential (primary) hypertension: Secondary | ICD-10-CM

## 2023-07-09 ENCOUNTER — Ambulatory Visit: Payer: Medicare Other | Admitting: Medical-Surgical

## 2023-07-09 ENCOUNTER — Other Ambulatory Visit: Payer: Self-pay | Admitting: Medical-Surgical

## 2023-07-09 DIAGNOSIS — I10 Essential (primary) hypertension: Secondary | ICD-10-CM

## 2023-08-27 ENCOUNTER — Ambulatory Visit (INDEPENDENT_AMBULATORY_CARE_PROVIDER_SITE_OTHER): Admitting: Medical-Surgical

## 2023-08-27 ENCOUNTER — Encounter: Payer: Self-pay | Admitting: Medical-Surgical

## 2023-08-27 VITALS — BP 113/67 | HR 62 | Resp 20 | Ht 67.0 in | Wt 173.1 lb

## 2023-08-27 DIAGNOSIS — R197 Diarrhea, unspecified: Secondary | ICD-10-CM

## 2023-08-27 DIAGNOSIS — R531 Weakness: Secondary | ICD-10-CM

## 2023-08-27 DIAGNOSIS — Z780 Asymptomatic menopausal state: Secondary | ICD-10-CM | POA: Diagnosis not present

## 2023-08-27 DIAGNOSIS — I10 Essential (primary) hypertension: Secondary | ICD-10-CM

## 2023-08-27 DIAGNOSIS — D649 Anemia, unspecified: Secondary | ICD-10-CM | POA: Diagnosis not present

## 2023-08-27 DIAGNOSIS — R7303 Prediabetes: Secondary | ICD-10-CM | POA: Diagnosis not present

## 2023-08-27 DIAGNOSIS — E538 Deficiency of other specified B group vitamins: Secondary | ICD-10-CM

## 2023-08-27 LAB — POCT URINALYSIS DIP (CLINITEK)
Bilirubin, UA: NEGATIVE
Blood, UA: NEGATIVE
Glucose, UA: NEGATIVE mg/dL
Ketones, POC UA: NEGATIVE mg/dL
Nitrite, UA: NEGATIVE
POC PROTEIN,UA: 30 — AB
Spec Grav, UA: >=1.03 — AB (ref 1.010–1.025)
Urobilinogen, UA: 0.2 U/dL
pH, UA: 6 (ref 5.0–8.0)

## 2023-08-27 LAB — POCT GLYCOSYLATED HEMOGLOBIN (HGB A1C): Hemoglobin A1C: 6.1 % — AB (ref 4.0–5.6)

## 2023-08-27 MED ORDER — TRAMADOL HCL 50 MG PO TABS: 50.0000 mg | ORAL_TABLET | Freq: Four times a day (QID) | ORAL | 0 refills | Status: AC | PRN

## 2023-08-27 MED ORDER — OMEPRAZOLE 40 MG PO CPDR: DELAYED_RELEASE_CAPSULE | ORAL | 1 refills | Status: DC

## 2023-08-27 MED ORDER — LORAZEPAM 1 MG PO TABS: 1.0000 mg | ORAL_TABLET | Freq: Three times a day (TID) | ORAL | 5 refills | Status: DC | PRN

## 2023-08-27 NOTE — Progress Notes (Signed)
 Established patient visit  History, exam, impression, and plan:  1. Weakness (Primary) Pleasant 83 year old female accompanied by her daughter and both their caregivers presenting today for evaluation of weakness.  She has a history of generalized weakness with multiple medical problems.  Reports that her level of weakness has increased and she is having difficulty with severe fatigue and poor motivation to get up and move.  Her caregiver reports that she has not been eating and that she has very little dietary intake, specifically with protein.  She also does not stay well-hydrated and tends to drink caffeinated beverages regularly.  Very sedentary due to weakness and physical debility.  Although I suspect her weakness is related to aging, sedentary lifestyle, and poor nutrition we will check labs as below to make sure that there is no metabolic cause.  POCT hemoglobin A1c checked today still in the prediabetic category but holding steady.  Discussed the importance of proper nutritional intake with small frequent meals/snacks that have a basis, high-protein, low-fat, and moderate carbohydrates.  Urinalysis done today showing elevated specific gravity consistent with dehydration, 30 mg/dL of protein, and trace leukocytes.  Sending for culture. - POCT HgB A1C - CBC with Differential/Platelet - CMP14+EGFR - TSH - Iron, TIBC and Ferritin Panel - VITAMIN D 25 Hydroxy (Vit-D Deficiency, Fractures) - Vitamin B12 - POCT URINALYSIS DIP (CLINITEK) - Urine Culture  2. Essential hypertension History of hypertension is currently treated with amlodipine 2.5 mg daily.  Patient tolerating well without side effects.  Brought her home cuff in today for validation of accuracy.  Initially inaccurate however after new batteries were placed, it is very close to our readings here in the office.  Recommend continuing to monitor blood pressure at home with a goal of 130/80 or less.  Blood pressure is  well-controlled today here in the office with a reading of 113/67.  Continue amlodipine as prescribed.  Checking labs. - CBC with Differential/Platelet - CMP14+EGFR  3. Postmenopausal She is postmenopausal and we do not have a DEXA scan result on file in the last 2 years.  Discussed the importance of monitoring bone density in order to prevent fractures.  Patient verbalized understanding and is agreeable.  Order placed. - DG Bone Density; Future  4. B12 deficiency Checking vitamin B12 today. - Vitamin B12  5. Prediabetes POCT hemoglobin A1c 6.1%.  Discussed recommendations for dietary limitation of concentrated sweets and simple carbohydrates. - POCT HgB A1C  6. Diarrhea, unspecified type Over the last 2 weeks has developed frequent episodes of diarrhea.  Notes that her symptoms started with fever and chills and has progressed to abdominal cramping and is completely unable to control these.  Wearing adult diapers.  Notes that her dog has also developed diarrhea and she jokingly wonders if she might have contracted "dogitis".  Has not tried any over-the-counter medications for her symptoms but notes that she is very frustrated that the diarrhea is not improving.  No recent hospitalization, travel, or antibiotic therapy.  No known sick contacts.  Discussed the likelihood of her symptoms being viral in nature and that it may take several weeks for this to clear up and return to normal.  In the meantime, she would like further evaluation so ordering stool studies as below. - C difficile Toxins A+B W/Rflx - Stool Culture - Ova and parasite examination - Stool Giardia/Cryptosporidium - Calprotectin, Fecal - Fecal lactoferrin, quant   Procedures performed this visit: None.  Return in about 6  weeks (around 10/08/2023) for mood follow up.  __________________________________ Maryl Snook, DNP, APRN, FNP-BC Primary Care and Sports Medicine [facility] [place]

## 2023-08-28 ENCOUNTER — Encounter: Payer: Self-pay | Admitting: Medical-Surgical

## 2023-08-28 LAB — CMP14+EGFR
ALT: 27 IU/L (ref 0–32)
AST: 25 IU/L (ref 0–40)
Albumin: 4.3 g/dL (ref 3.7–4.7)
Alkaline Phosphatase: 120 IU/L (ref 44–121)
BUN/Creatinine Ratio: 17 (ref 12–28)
BUN: 14 mg/dL (ref 8–27)
Bilirubin Total: 0.4 mg/dL (ref 0.0–1.2)
CO2: 19 mmol/L — ABNORMAL LOW (ref 20–29)
Calcium: 9.1 mg/dL (ref 8.7–10.3)
Chloride: 106 mmol/L (ref 96–106)
Creatinine, Ser: 0.81 mg/dL (ref 0.57–1.00)
Globulin, Total: 2.5 g/dL (ref 1.5–4.5)
Glucose: 107 mg/dL — ABNORMAL HIGH (ref 70–99)
Potassium: 3.7 mmol/L (ref 3.5–5.2)
Sodium: 140 mmol/L (ref 134–144)
Total Protein: 6.8 g/dL (ref 6.0–8.5)
eGFR: 73 mL/min/{1.73_m2} (ref >59)

## 2023-08-28 LAB — CBC WITH DIFFERENTIAL/PLATELET
Basophils Absolute: 0.1 10*3/uL (ref 0.0–0.2)
Basos: 1 %
EOS (ABSOLUTE): 0.1 10*3/uL (ref 0.0–0.4)
Eos: 2 %
Hematocrit: 39 % (ref 34.0–46.6)
Hemoglobin: 13.2 g/dL (ref 11.1–15.9)
Immature Grans (Abs): 0 10*3/uL (ref 0.0–0.1)
Immature Granulocytes: 1 %
Lymphocytes Absolute: 2.7 10*3/uL (ref 0.7–3.1)
Lymphs: 41 %
MCH: 30.5 pg (ref 26.6–33.0)
MCHC: 33.8 g/dL (ref 31.5–35.7)
MCV: 90 fL (ref 79–97)
Monocytes Absolute: 0.3 10*3/uL (ref 0.1–0.9)
Monocytes: 5 %
Neutrophils Absolute: 3.3 10*3/uL (ref 1.4–7.0)
Neutrophils: 50 %
Platelets: 249 10*3/uL (ref 150–450)
RBC: 4.33 x10E6/uL (ref 3.77–5.28)
RDW: 12.8 % (ref 11.7–15.4)
WBC: 6.5 10*3/uL (ref 3.4–10.8)

## 2023-08-28 LAB — TSH: TSH: 1.91 u[IU]/mL (ref 0.450–4.500)

## 2023-08-28 LAB — IRON,TIBC AND FERRITIN PANEL
Ferritin: 105 ng/mL (ref 15–150)
Iron Saturation: 34 % (ref 15–55)
Iron: 90 ug/dL (ref 27–139)
Total Iron Binding Capacity: 267 ug/dL (ref 250–450)
UIBC: 177 ug/dL (ref 118–369)

## 2023-08-28 LAB — VITAMIN B12: Vitamin B-12: 614 pg/mL (ref 232–1245)

## 2023-08-28 LAB — VITAMIN D 25 HYDROXY (VIT D DEFICIENCY, FRACTURES): Vit D, 25-Hydroxy: 25.8 ng/mL — ABNORMAL LOW (ref 30.0–100.0)

## 2023-08-29 LAB — URINE CULTURE

## 2023-08-30 ENCOUNTER — Ambulatory Visit: Payer: Self-pay

## 2023-08-30 DIAGNOSIS — R197 Diarrhea, unspecified: Secondary | ICD-10-CM | POA: Diagnosis not present

## 2023-08-30 NOTE — Telephone Encounter (Signed)
 Pls advise. Thanks Denece Finger, CMA

## 2023-08-30 NOTE — Telephone Encounter (Signed)
 Reason for Triage: Rema Care (daughter of patient)called to advise that her mother's stool sample is arriving to the office today for testing. She states that this morning she is having projectile diarrhea and very worried.  [phone] (daughter number)  Answer Assessment - Initial Assessment Questions 1. REASON FOR CALL or QUESTION: "What is your reason for calling today?" or "How can I best help you?" or "What question do you have that I can help answer?"     Pt's daughter called concerned about pt: stated patient continues to get weaker and weaker as she is unable to keep anything down on her stomach.  Stated everything is worse: pt continues to have more of the projectile diarrhea - as soon as she sits down she has to get back up and go back to the bathroom. Daughter stated she is trying to keep fluids in pt but it is hard and her mom is getting weaker.  Daughter would like to know if the stool sample can be listed as top priority to find out what is going on with the patient & try to get her back to normal.  Protocols used: Information Only Call - No Triage-A-AH

## 2023-08-31 NOTE — Telephone Encounter (Signed)
 Spoke with patient daughter  - she will take patient to ER  at [facility] [place] this morning.

## 2023-09-01 LAB — CALPROTECTIN, FECAL: Calprotectin, Fecal: 245 ug/g — ABNORMAL HIGH (ref 0–120)

## 2023-09-01 LAB — SPECIMEN STATUS REPORT

## 2023-09-03 LAB — OVA AND PARASITE EXAMINATION

## 2023-09-03 LAB — SPECIMEN STATUS REPORT

## 2023-09-06 ENCOUNTER — Telehealth: Payer: Self-pay

## 2023-09-06 NOTE — Telephone Encounter (Signed)
 Copied from CRM [id number]. Topic: Clinical - Lab/Test Results >> Sep 03, 2023  1:45 PM Valeri Gate H wrote: Reason for CRM: Patient's daughter Germain Kohler was calling in to get results from stool sample. Would like a call back. [phone]

## 2023-09-08 ENCOUNTER — Other Ambulatory Visit

## 2023-09-13 ENCOUNTER — Telehealth: Payer: Self-pay

## 2023-09-13 NOTE — Telephone Encounter (Signed)
 Copied from CRM [id number]. Topic: Clinical - Lab/Test Results >> Sep 03, 2023  1:45 PM Valeri Gate H wrote: Reason for CRM: Patient's daughter Germain Kohler was calling in to get results from stool sample. Would like a call back. [phone]

## 2023-09-13 NOTE — Telephone Encounter (Signed)
 They didn't receive the O and P results.

## 2023-09-14 ENCOUNTER — Other Ambulatory Visit: Payer: Self-pay | Admitting: Medical-Surgical

## 2023-09-14 DIAGNOSIS — I10 Essential (primary) hypertension: Secondary | ICD-10-CM

## 2023-09-14 DIAGNOSIS — J849 Interstitial pulmonary disease, unspecified: Secondary | ICD-10-CM

## 2023-09-15 ENCOUNTER — Telehealth: Payer: Self-pay

## 2023-09-15 NOTE — Telephone Encounter (Signed)
 Left message for a return call

## 2023-09-15 NOTE — Telephone Encounter (Signed)
 Spoke with patient and message sent to provider in separate message

## 2023-09-15 NOTE — Telephone Encounter (Signed)
 Spoke with patient daughter page and relayed results from stool studies.  She states that her mother is feeling a little bit better and diarrhea is just a little bit better. They are pushing fluids for her but this is difficult at times.  Have all of the lab results came in now ?   Hi [name], Your stool studies are trickling in. So far no ova/parasites. Your fecal calprotectin is elevated but we need the rest of the results before we can determine the plan. Are you feeling any better? Has the diarrhea improved? Thanks, Joy  Written by Cherre Cornish, NP on 09/06/2023  7:07 AM EDT Seen by patient Courtney Cummings on 09/06/2023  1:28 PM

## 2023-10-07 ENCOUNTER — Ambulatory Visit (INDEPENDENT_AMBULATORY_CARE_PROVIDER_SITE_OTHER): Admitting: Medical-Surgical

## 2023-10-07 ENCOUNTER — Encounter: Payer: Self-pay | Admitting: Medical-Surgical

## 2023-10-07 VITALS — BP 128/77 | HR 55 | Resp 20 | Ht 67.0 in | Wt 173.0 lb

## 2023-10-07 DIAGNOSIS — R197 Diarrhea, unspecified: Secondary | ICD-10-CM | POA: Diagnosis not present

## 2023-10-07 DIAGNOSIS — R110 Nausea: Secondary | ICD-10-CM | POA: Diagnosis not present

## 2023-10-07 DIAGNOSIS — F411 Generalized anxiety disorder: Secondary | ICD-10-CM

## 2023-10-07 DIAGNOSIS — F3341 Major depressive disorder, recurrent, in partial remission: Secondary | ICD-10-CM

## 2023-10-07 MED ORDER — ONDANSETRON 4 MG PO TBDP: 4.0000 mg | ORAL_TABLET | Freq: Three times a day (TID) | ORAL | 0 refills | Status: AC | PRN

## 2023-10-07 MED ORDER — DICYCLOMINE HCL 10 MG PO CAPS: 10.0000 mg | ORAL_CAPSULE | Freq: Three times a day (TID) | ORAL | 0 refills | Status: DC

## 2023-10-07 NOTE — Progress Notes (Signed)
        Established patient visit  History, exam, impression, and plan:  1. Recurrent major depressive disorder, in partial remission (HCC) (Primary) 2. Generalized anxiety disorder Pleasant 83 year old female accompanied by her daughter and their two caretakers presenting today for follow up on mood. She has a history of both anxiety and depression. Was previously seen by Dr. Dania Dupre for psychiatry but wanted to move her medication management to our office. Currently taking Sertraline  25mg  daily, tolerating well without side effects. Was previously prescribed 50mg  daily but did not want to be on the higher dose so went back to 25mg  instead. Has Ativan  1mg  TID prn that she uses to combat anxiety. Has been on this long term and is now dependent on it. Admits that her mood is not great at this point. She has multiple factors that contribute to this including physical symptoms, activity intolerance, the need to rely on others, and loss of independence. Also have a hard time with the GI symptoms noted below. Denies SI/HI. Discussed treatment options. Recommend increasing Sertraline  to 50mg  daily but she would prefer to remain at 25mg  daily instead. Does not desire medication change or the addition of other agents at this time. Continue Sertraline  and Ativan  as prescribed but if symptoms worsen, return for reevaluation.   3. Diarrhea, unspecified type 4. Nausea Continues to have issues with frequent episodes of diarrhea and nausea that have not improved over the last month. Her labs were unrevealing and the stool studies that were completed were normal. There are still 4 stool studies that were not completed. She is still not eating well and eats very few foods. Having episodes of nausea. When she does eat, she has diarrhea shortly after. Feels that she is getting weaker by the day and is understandably worried. Ideally, would like to have her get in with GI but she is hesitant to do so. For now, plan to collect  remaining stool studies for evaluation of Stool culture and c-diff. Adding Zofran  for nausea and trialing Dicyclomine before meals to help with diarrhea. Recommend working on increasing dietary intake with an aim for fiber and protein rich foods.   Procedures performed this visit: None.  Return in about 4 weeks (around 11/04/2023) for nausea/diarrhea follow up.  __________________________________ Maryl Snook, DNP, APRN, FNP-BC Primary Care and Sports Medicine [facility] [place]

## 2023-10-09 ENCOUNTER — Encounter: Payer: Self-pay | Admitting: Medical-Surgical

## 2023-10-13 ENCOUNTER — Telehealth: Payer: Self-pay

## 2023-10-13 DIAGNOSIS — R531 Weakness: Secondary | ICD-10-CM

## 2023-10-13 DIAGNOSIS — R2689 Other abnormalities of gait and mobility: Secondary | ICD-10-CM

## 2023-10-13 DIAGNOSIS — R54 Age-related physical debility: Secondary | ICD-10-CM

## 2023-10-13 NOTE — Telephone Encounter (Signed)
 Copied from CRM [id number]. Topic: Referral - Question >> Oct 13, 2023  3:21 PM Lenon Radar A wrote: Reason for CRM: Patients daughter called in regarding referral for physical therapy to be sent. Patients daughters name is not listed under DPR. Please contact patient fot assistance with referral. Advised daughter that she will need to complete the DPR information in office.  The daughter's name is in the DPR under Rema Care and Siri Duet Grooms is listed as caregiver.  Germain Kohler is wanting to know if you sent in a referral for PT.

## 2023-10-14 ENCOUNTER — Other Ambulatory Visit: Payer: Self-pay | Admitting: Medical-Surgical

## 2023-10-14 DIAGNOSIS — I10 Essential (primary) hypertension: Secondary | ICD-10-CM

## 2023-10-14 NOTE — Telephone Encounter (Signed)
 This request has been handled. No further action is required. Courtney Cummings has been updated regarding the referral for PT for the patient. Verbalized understanding.

## 2023-10-14 NOTE — Addendum Note (Signed)
 Addended byCherre Cornish on: 10/14/2023 07:34 AM   Modules accepted: Orders

## 2023-10-14 NOTE — Telephone Encounter (Signed)
 We had discussed physical therapy and there was some back and forth regarding if she was willing to do it. Please pass along my apologies for the delay. I have placed the referral and notified the physical therapy office to get her scheduled.   ___________________________________________ Maryl Snook, DNP, APRN, FNP-BC Primary Care and Sports Medicine [facility] [place]

## 2023-10-19 ENCOUNTER — Other Ambulatory Visit: Payer: Self-pay | Admitting: Medical-Surgical

## 2023-10-19 DIAGNOSIS — J849 Interstitial pulmonary disease, unspecified: Secondary | ICD-10-CM

## 2023-10-26 ENCOUNTER — Telehealth: Payer: Self-pay

## 2023-10-26 NOTE — Telephone Encounter (Signed)
 Noted

## 2023-10-26 NOTE — Telephone Encounter (Signed)
 Copied from CRM [id number]. Topic: Clinical - Medical Advice >> Oct 26, 2023  2:49 PM Danelle Dunning F wrote: Reason for CRM:   Patient's caregiver and daughter, Germain Kohler, is calling in to state that the upcoming appointment that the patient has in place but they have yet to complete all test required for that appointment; The patient has yet to submit a stool sample or take the prescribed medication for her GI concern. The appointment will be cancelled at the daughter's request and she will reschedule once the tests requested by the patient's PCP have been completed.  Patient daughter did want to inform the office of the current matters.   Callback Number: [phone]

## 2023-10-27 ENCOUNTER — Encounter: Payer: Self-pay | Admitting: Rehabilitative and Restorative Service Providers"

## 2023-10-27 ENCOUNTER — Ambulatory Visit: Attending: Medical-Surgical | Admitting: Rehabilitative and Restorative Service Providers"

## 2023-10-27 ENCOUNTER — Other Ambulatory Visit: Payer: Self-pay

## 2023-10-27 DIAGNOSIS — R531 Weakness: Secondary | ICD-10-CM | POA: Diagnosis not present

## 2023-10-27 DIAGNOSIS — M6281 Muscle weakness (generalized): Secondary | ICD-10-CM | POA: Diagnosis not present

## 2023-10-27 DIAGNOSIS — R2689 Other abnormalities of gait and mobility: Secondary | ICD-10-CM | POA: Diagnosis not present

## 2023-10-27 DIAGNOSIS — R2681 Unsteadiness on feet: Secondary | ICD-10-CM | POA: Insufficient documentation

## 2023-10-27 DIAGNOSIS — R54 Age-related physical debility: Secondary | ICD-10-CM | POA: Insufficient documentation

## 2023-10-27 NOTE — Therapy (Signed)
 OUTPATIENT PHYSICAL THERAPY NEURO EVALUATION   Patient Name: Courtney Cummings MRN: [id number] DOB:March 19, 1941, 83 y.o., female Today's Date: 10/27/2023  PCP: Cherre Cornish, NP  REFERRING PROVIDER: Cherre Cornish, NP  END OF SESSION:  PT End of Session - 10/27/23 1113     Visit Number 1    Number of Visits 16    Date for PT Re-Evaluation 12/26/23    Authorization Type [employer] medicare prior auth required    PT Start Time 1107    PT Stop Time 1145    PT Time Calculation (min) 38 min    Equipment Utilized During Treatment Gait belt    Activity Tolerance Patient tolerated treatment well    Behavior During Therapy WFL for tasks assessed/performed             Past Medical History:  Diagnosis Date   Acute right eye pain 03/25/2021   Allergy status to unspecified drugs, medicaments and biological substances 02/23/2013   Asthma, mild persistent 03/02/2013   B12 deficiency 08/24/2017   Chronic anxiety 04/08/2020   Formatting of this note might be different from the original. sees dr. love   Chronic depression 04/08/2020   Formatting of this note might be different from the original. sees dr. love   Cognitive disorder 10/22/2012   Colon polyps    COPD (chronic obstructive pulmonary disease) (HCC)    Dizziness 10/22/2012   DVT (deep venous thrombosis) (HCC)    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   Formatting of this note might be different from the original. Added automatically from request for surgery [id number]   Essential hypertension 10/22/2012   Gastroesophageal reflux disease without esophagitis 10/22/2012   Generalized anxiety disorder 11/15/2012   Heart attack (HCC)    History of colon polyps 01/13/2017   History of hysterectomy 02/09/2017   History of recurrent UTIs 11/19/2016   History of shingles 04/08/2020   Hyperlipidemia    Idiopathic peripheral neuropathy 01/30/2020   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 05/16/2018   Formatting of this note might be different from the original. sees dr. Hobert Lull    Interstitial lung disease (HCC)    Irregular heart rhythm 04/08/2020   Formatting of this note might be different from the original. seen by dr. 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   Nuclear sclerotic cataract of left eye 06/25/2016   Osteoarthrosis 10/22/2012   Other nonspecific abnormal finding of lung field 03/02/2013   Personal history of malignant neoplasm of breast 09/23/2011   Prediabetes 04/08/2020   Recurrent falls 01/07/2017   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   Right-sided headache 04/02/2021   Statin intolerance 07/21/2017   Transient ischemic attack (TIA) 03/04/2017   Trigeminal neuralgia of right side of face 03/25/2021   Past Surgical History:  Procedure Laterality Date   ABDOMINAL HYSTERECTOMY     APPENDECTOMY     BREAST LUMPECTOMY     CHOLECYSTECTOMY     Patient Active Problem List   Diagnosis Date Noted   SVT (supraventricular tachycardia) (HCC) 07/03/2022   Balance problem 03/16/2022   Suprapubic pressure 03/16/2022   Bilateral leg weakness 03/13/2022   Abnormal EKG 08/28/2021   Palpitations 08/28/2021  Cardiac murmur 08/28/2021   Colon polyps 08/26/2021   COPD (chronic obstructive pulmonary disease) (HCC) 08/26/2021   Heart attack (HCC) 08/26/2021   ILD (interstitial lung disease) (HCC) 08/26/2021   Kidney disease 08/26/2021   MRSA (methicillin resistant Staphylococcus aureus) 08/26/2021   Prediabetes 04/08/2020   Idiopathic peripheral neuropathy 01/30/2020   Major depressive disorder, recurrent, 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   Grief 12/14/2018   Ataxia 07/14/2018   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 05/16/2018   Skin erosion 01/18/2018   LBBB (left bundle branch block) 09/22/2017   B12 deficiency 08/24/2017   Statin intolerance 07/21/2017   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   Transient ischemic attack (TIA) 03/04/2017   Recurrent major depressive disorder (HCC) 03/04/2017   Rectocele 02/09/2017   Recurrent falls 01/07/2017   Nuclear sclerotic cataract of left eye 06/25/2016   Pulmonary nodules/lesions, multiple 11/07/2015   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   Bronchiectasis (HCC) 03/02/2013   Allergy status to unspecified drugs, medicaments and biological substances 02/23/2013   Generalized anxiety disorder 11/15/2012   Cognitive disorder 10/22/2012   Essential hypertension 10/22/2012   Gastroesophageal reflux disease without esophagitis 10/22/2012   Hyperlipidemia 10/22/2012   Osteoarthrosis 10/22/2012   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 (HCC) 08/09/2012   Personal history of malignant neoplasm of breast 09/23/2011    ONSET DATE: 10/14/23  REFERRING DIAG:  R54 (ICD-10-CM) - Age-related physical debility  R26.89 (ICD-10-CM) - Balance problem  R53.1 (ICD-10-CM) - Generalized weakness    THERAPY DIAG:  Other abnormalities of gait and mobility  Muscle weakness (generalized)  Unsteadiness on feet  Rationale for Evaluation and Treatment: Rehabilitation  SUBJECTIVE:                                                                                                                                                                                             SUBJECTIVE STATEMENT: The patient reports increasing weakness and instability. She has been using a 3 wheeled rollator for 5 years  indoors and outdoor surfaces.  She does have a h/o dizziness.  Pt accompanied by: home aide that helps with chores-- she waited in lobby  PERTINENT HISTORY: h/o breast cancer (h/o chemo), TIA, COPD, MI, recurrent falls  PAIN:  Are you having pain? No (during eval--- she does get pain with attempted shoulder ROM)  PRECAUTIONS: Fall  WEIGHT BEARING RESTRICTIONS: No  FALLS: Has patient fallen in last 6 months? 1x/ in the  past 6 months (has had episodes of multiple falls describing 100+ falls in a year)  LIVING ENVIRONMENT: Lives with: lives alone Lives in: House/apartment Stairs: Yes: External: has a ramp for entry/exit to home steps; can reach both Has following equipment at home: Ramped entry; 3 wheeled walker  PLOF: Independent with basic ADLs; has help with grocery shopping and transportation; patient does her own shower + personal care (has someone available due to h/o falls)  PATIENT GOALS: improve balance  OBJECTIVE:  Note: Objective measures were completed at Evaluation unless otherwise noted.  COGNITION: Overall cognitive status: she notes memory is limited since chemo 20 years ago   SENSATION: Numbness in toes and bottom of her feet  POSTURE: rounded shoulders, forward head, and posterior pelvic tilt  POSITIONAL:   negative for bilateral roll test and bilat sidelying test  UPPER EXTREMITY ROM: shoulder flexion limited since surgery for breast cancer (has 80% of full ROM), unable to reach behind her head worse on L UE than R UE.  LOWER EXTREMITY ROM: WFLs for walking. She reports her toes have limited movement and she has neuropathy that leads to pain in feet  LOWER EXTREMITY MMT:   MMT Right Eval Left Eval  Hip flexion 3/5 3/5  Hip extension    Hip abduction    Hip adduction    Hip internal rotation    Hip external rotation    Knee flexion 4/5 4/5  Knee extension 4/5 4/5  Ankle dorsiflexion 3/5 3/5  Ankle plantarflexion    Ankle inversion    Ankle  eversion    (Blank rows = not tested)  BED MOBILITY:  Findings: Sit to supine Complete Independence Supine to sit Complete Independence Rolling to Right Complete Independence Rolling to Left Complete Independence  TRANSFERS: Sit to stand: Modified independence  Assistive device utilized: needs arm rests and stand to rollator      GAIT: Findings: Gait Characteristics: slowed pace and decreased stride length, Distance walked: 40 feet, Assistive device utilized:3 wheeled walker, Level of assistance: Modified independence, and Comments: Patient uses walker indoors and in community  FUNCTIONAL TESTS:  5 times sit to stand: 37.50 seconds with bilat UEs Timed up and go (TUG): TBA Berg Balance Scale:  Item Test date: 10/27/23 Test date: future test Test date: future test  Sitting to standing 2. able to stand using hands after several tries Insert OPRCBERGREEVAL SmartPhrase at re-test date Insert OPRCBERGREEVAL SmartPhrase at re-test date  2. Standing unsupported 2. able to stand 30 seconds unsupported    3. Sitting with back unsupported, feet supported 4. able to sit safely and securely for 2 minutes    4. Standing to sitting 3. controls descent by using hands    5. Pivot transfer  3. able to transfer safely with definite need of hands    6. Standing unsupported with eyes closed 1. unable to keep eyes closed 3 seconds but stays standing safely    7. Standing unsupported with feet together 1. needs help to attain position but able to stand 15 seconds feet together    8. Reaching forward with outstretched arms while standing 2. can reach forward 5 cm (2 inches)    9. Pick up object from the floor from standing 0. unable to try/needs assistance to keep from losing balance or falling    10. Turning to look behind over left and right shoulders while standing 1. needs supervision when turning    11. Turn 360 degrees 0. needs assistance while turning  12. Place alternate foot on step or stool while  standing unsupported 0. needs assistance to keep from falling/unable to try    13. Standing unsupported one foot in front 0. loses balance while stepping or standing    14. Standing on one leg 0. unable to try of needs assist to prevent fall      Total Score 19/56 Total Score TBA/56 Total Score TBA/56     PATIENT SURVEYS:  Patient-specific activity scoring scheme (Point to one number):  0 represents "unable to perform." 10 represents "able to perform at prior level. 0 1 2 3 4 5 6 7 8 9  10 (Date and Score) Activity Initial  Activity Eval     Walking in home with walker (3 wheeled) 5/10    Sit to stand 3/10    TOTAL     Total score = sum of the activity scores/number of activities Minimum detectable change (90%CI) for average score = 2 points Minimum detectable change (90%CI) for single activity score = 3 points  Score: 8/20   [facility] Adult PT Treatment:                                                DATE: 10/27/23 Therapeutic Exercise: Sit<>stand x 5 reps Discussed home safety and using a chair with arm rests Discussed PT plan and engagement with HEP   PATIENT EDUCATION: Education details: HEP, PT plan of care Person educated: Patient Education method: Explanation, Demonstration, and Handouts Education comprehension: verbalized understanding and needs further education  HOME EXERCISE PROGRAM: Access Code: [email or web address] URL: [email or web address] Date: 10/27/2023 Prepared by: Trygve Gage  Exercises - Sit to Stand with Counter Support  - 2 x daily - 7 x weekly - 1 sets - 10 reps  GOALS: Goals reviewed with patient? Yes  SHORT TERM GOALS: Target date: 11/25/23  The patient will be indep with initial HEP Baseline: initiated at eval Goal status: INITIAL  2.  The patient will improve Berg to > or equal to 25/56   Baseline:  19/56 Goal status: INITIAL  3.  The patient will decrease 5 time sit<>stand to < or equal to 30 seconds Baseline: 37.5  seconds Goal status: INITIAL  LONG TERM GOALS: Target date: 12/26/23  The patient will be indep with progression of HEP. Baseline:  initiated at eval Goal status: INITIAL  2.  The patient will improve PSFS from 8/20 up to 10/20 to demonstrate improved functional abilities. Baseline: 8/20 Goal status: INITIAL  3.  The patient will improve Berg to > or equal to 30/56. Baseline:  19/56 Goal status: INITIAL  4.  The patient will perform 5 time sit<>stand in < or equal to 25 seconds Baseline:  37.5 seconds Goal status: INITIAL  5.  The patient will improve TUG score and goal to follow. Baseline:  TBA Goal status: INITIAL  ASSESSMENT:  CLINICAL IMPRESSION: Patient is an 83 y.o. female who was seen today for physical therapy evaluation and treatment for muscle weakness and imbalance. The patient presents with LE weakness, slowed gait speed, dec'd balance, trunk weakness, and overall decline in functional status. PT to address deficits to promote improved mobility in the home and reduced risk for falls.   OBJECTIVE IMPAIRMENTS: Abnormal gait, decreased activity tolerance, decreased balance, decreased mobility, difficulty walking, decreased strength, dizziness, increased fascial restrictions, impaired flexibility, impaired sensation, and postural dysfunction.  ACTIVITY LIMITATIONS: lifting, bending, standing, squatting, stairs, and locomotion level  PARTICIPATION LIMITATIONS: meal prep, cleaning, and community activity  PERSONAL FACTORS: 3+ comorbidities: h/o TIA, COPD, breast cancer, neuropathy are also affecting patient's functional outcome.   REHAB POTENTIAL: Good  CLINICAL DECISION MAKING: Evolving/moderate complexity  EVALUATION COMPLEXITY: Moderate  PLAN:  PT FREQUENCY: 2x/week  PT DURATION: 8 weeks  PLANNED INTERVENTIONS: 97164- PT Re-evaluation, 97750- Physical Performance Testing, 97110-Therapeutic exercises, 97530- Therapeutic activity, 97112- Neuromuscular  re-education, 97535- Self Care, 40981- Manual therapy, [id number]- Gait training, Patient/Family education, Balance training, Stair training, and Vestibular training  PLAN FOR NEXT SESSION: Assess TUG test, add more HEP for LE strength/balance (consider [place] program for home), gait activities.   Dequincy Born, PT 10/27/2023, 3:46 PM

## 2023-10-29 ENCOUNTER — Other Ambulatory Visit: Payer: Self-pay | Admitting: Medical-Surgical

## 2023-11-03 ENCOUNTER — Ambulatory Visit: Admitting: Rehabilitative and Restorative Service Providers"

## 2023-11-10 ENCOUNTER — Ambulatory Visit: Admitting: Rehabilitative and Restorative Service Providers"

## 2023-11-11 ENCOUNTER — Ambulatory Visit: Admitting: Medical-Surgical

## 2023-11-17 ENCOUNTER — Encounter: Admitting: Rehabilitative and Restorative Service Providers"

## 2023-11-17 NOTE — Therapy (Deleted)
 OUTPATIENT PHYSICAL THERAPY NEURO TREATMENT   Patient Name: Courtney Cummings MRN: [id number] DOB:04-16-1941, 83 y.o., female Today's Date: 11/17/2023  PCP: Zada Palin, NP  REFERRING PROVIDER: Zada Palin, NP  END OF SESSION:    Past Medical History:  Diagnosis Date   Acute right eye pain 03/25/2021   Allergy status to unspecified drugs, medicaments and biological substances 02/23/2013   Asthma, mild persistent 03/02/2013   B12 deficiency 08/24/2017   Chronic anxiety 04/08/2020   Formatting of this note might be different from the original. sees dr. love   Chronic depression 04/08/2020   Formatting of this note might be different from the original. sees dr. love   Cognitive disorder 10/22/2012   Colon polyps    COPD (chronic obstructive pulmonary disease) (HCC)    Dizziness 10/22/2012   DVT (deep venous thrombosis) (HCC)    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   Formatting of this note might be different from the original. Added automatically from request for surgery [id number]   Essential hypertension 10/22/2012   Gastroesophageal reflux disease without esophagitis 10/22/2012   Generalized anxiety disorder 11/15/2012   Heart attack [facility])    History of colon polyps 01/13/2017   History of hysterectomy 02/09/2017   History of recurrent UTIs 11/19/2016   History of shingles 04/08/2020   Hyperlipidemia    Idiopathic peripheral neuropathy 01/30/2020   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 05/16/2018   Formatting of this note might be different from the original. sees dr. cherly   Interstitial lung disease (HCC)    Irregular heart rhythm 04/08/2020   Formatting of this note might be different from the original. seen by dr. 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   Nuclear sclerotic cataract of left eye 06/25/2016   Osteoarthrosis 10/22/2012   Other nonspecific abnormal finding of lung field 03/02/2013   Personal history of malignant neoplasm of breast 09/23/2011   Prediabetes 04/08/2020   Recurrent falls 01/07/2017   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   Right-sided headache 04/02/2021   Statin intolerance 07/21/2017   Transient ischemic attack (TIA) 03/04/2017   Trigeminal neuralgia of right side of face 03/25/2021   Past Surgical History:  Procedure Laterality Date   ABDOMINAL HYSTERECTOMY     APPENDECTOMY     BREAST LUMPECTOMY     CHOLECYSTECTOMY     Patient Active Problem List   Diagnosis Date Noted   SVT (supraventricular tachycardia) (HCC) 07/03/2022   Balance problem 03/16/2022   Suprapubic pressure 03/16/2022   Bilateral leg weakness 03/13/2022   Abnormal EKG 08/28/2021   Palpitations 08/28/2021   Cardiac murmur 08/28/2021   Colon polyps 08/26/2021   COPD (chronic obstructive pulmonary disease) (HCC) 08/26/2021   Heart attack (HCC) 08/26/2021   ILD (interstitial lung disease) (HCC) 08/26/2021   Kidney disease 08/26/2021   MRSA (methicillin resistant Staphylococcus aureus) 08/26/2021   Prediabetes 04/08/2020   Idiopathic peripheral neuropathy 01/30/2020   Major depressive disorder, recurrent, 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   Grief 12/14/2018   Ataxia 07/14/2018   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 05/16/2018   Skin erosion  01/18/2018   LBBB (left bundle branch block) 09/22/2017   B12 deficiency 08/24/2017   Statin intolerance 07/21/2017   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   Transient ischemic attack (TIA) 03/04/2017   Recurrent major depressive disorder  (HCC) 03/04/2017   Rectocele 02/09/2017   Recurrent falls 01/07/2017   Nuclear sclerotic cataract of left eye 06/25/2016   Pulmonary nodules/lesions, multiple 11/07/2015   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   Bronchiectasis (HCC) 03/02/2013   Allergy status to unspecified drugs, medicaments and biological substances 02/23/2013   Generalized anxiety disorder 11/15/2012   Cognitive disorder 10/22/2012   Essential hypertension 10/22/2012   Gastroesophageal reflux disease without esophagitis 10/22/2012   Hyperlipidemia 10/22/2012   Osteoarthrosis 10/22/2012   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 (HCC) 08/09/2012   Personal history of malignant neoplasm of breast 09/23/2011    ONSET DATE: 10/14/23 REFERRING DIAG:  R54 (ICD-10-CM) - Age-related physical debility  R26.89 (ICD-10-CM) - Balance problem  R53.1 (ICD-10-CM) - Generalized weakness    THERAPY DIAG:  No diagnosis found.  Rationale for Evaluation and Treatment: Rehabilitation  SUBJECTIVE:                                                                                                                                                                                          SUBJECTIVE STATEMENT: The patient reports increasing weakness and instability. She has been using a 3 wheeled rollator for 5 years indoors and outdoor surfaces.  She does have a h/o dizziness.  Pt accompanied by: home aide that helps with chores-- she waited in lobby  PERTINENT HISTORY: h/o breast cancer (h/o chemo), TIA, COPD, MI, recurrent falls  PAIN:  Are you having pain? No (during eval--- she does get pain with attempted shoulder ROM)  PRECAUTIONS: Fall  WEIGHT BEARING RESTRICTIONS: No  FALLS: Has patient fallen in last 6 months? 1x/ in the past 6 months (has had episodes of multiple falls describing 100+ falls in a year)  LIVING  ENVIRONMENT: Lives with: lives alone Lives in: House/apartment Stairs: Yes: External: has a ramp for entry/exit to home steps; can reach both Has following equipment at home: Ramped entry; 3 wheeled walker  PLOF: Independent with basic ADLs; has help with grocery shopping and transportation; patient does her own shower + personal care (has someone available due to h/o falls)  PATIENT GOALS: improve balance  OBJECTIVE:  Note: Objective measures were completed at Evaluation unless otherwise noted. COGNITION: Overall cognitive status: she notes memory is limited since chemo 20 years ago   SENSATION: Numbness in  toes and bottom of her feet  POSTURE: rounded shoulders, forward head, and posterior pelvic tilt  POSITIONAL:   negative for bilateral roll test and bilat sidelying test  UPPER EXTREMITY ROM: shoulder flexion limited since surgery for breast cancer (has 80% of full ROM), unable to reach behind her head worse on L UE than R UE.  LOWER EXTREMITY ROM: WFLs for walking. She reports her toes have limited movement and she has neuropathy that leads to pain in feet  LOWER EXTREMITY MMT:   MMT Right Eval Left Eval  Hip flexion 3/5 3/5  Hip extension    Hip abduction    Hip adduction    Hip internal rotation    Hip external rotation    Knee flexion 4/5 4/5  Knee extension 4/5 4/5  Ankle dorsiflexion 3/5 3/5  Ankle plantarflexion    Ankle inversion    Ankle eversion    (Blank rows = not tested)  BED MOBILITY:  Findings: Sit to supine Complete Independence Supine to sit Complete Independence Rolling to Right Complete Independence Rolling to Left Complete Independence  TRANSFERS: Sit to stand: Modified independence  Assistive device utilized: needs arm rests and stand to rollator      GAIT: Findings: Gait Characteristics: slowed pace and decreased stride length, Distance walked: 40 feet, Assistive device utilized:3 wheeled walker, Level of assistance: Modified  independence, and Comments: Patient uses walker indoors and in community  FUNCTIONAL TESTS:  5 times sit to stand: 37.50 seconds with bilat UEs Timed up and go (TUG): TBA Berg Balance Scale:  Item Test date: 10/27/23 Test date: future test Test date: future test  Sitting to standing 2. able to stand using hands after several tries Insert OPRCBERGREEVAL SmartPhrase at re-test date Insert OPRCBERGREEVAL SmartPhrase at re-test date  2. Standing unsupported 2. able to stand 30 seconds unsupported    3. Sitting with back unsupported, feet supported 4. able to sit safely and securely for 2 minutes    4. Standing to sitting 3. controls descent by using hands    5. Pivot transfer  3. able to transfer safely with definite need of hands    6. Standing unsupported with eyes closed 1. unable to keep eyes closed 3 seconds but stays standing safely    7. Standing unsupported with feet together 1. needs help to attain position but able to stand 15 seconds feet together    8. Reaching forward with outstretched arms while standing 2. can reach forward 5 cm (2 inches)    9. Pick up object from the floor from standing 0. unable to try/needs assistance to keep from losing balance or falling    10. Turning to look behind over left and right shoulders while standing 1. needs supervision when turning    11. Turn 360 degrees 0. needs assistance while turning    12. Place alternate foot on step or stool while standing unsupported 0. needs assistance to keep from falling/unable to try    13. Standing unsupported one foot in front 0. loses balance while stepping or standing    14. Standing on one leg 0. unable to try of needs assist to prevent fall      Total Score 19/56 Total Score TBA/56 Total Score TBA/56    PATIENT SURVEYS:  Patient-specific activity scoring scheme (Point to one number):  0 represents "unable to perform." 10 represents "able to perform at prior level. 0 1 2 3 4 5 6 7 8 9  10 (Date and  Score)  Activity Initial  Activity Eval     Walking in home with walker (3 wheeled) 5/10    Sit to stand 3/10    TOTAL     Total score = sum of the activity scores/number of activities Minimum detectable change (90%CI) for average score = 2 points Minimum detectable change (90%CI) for single activity score = 3 points Score: 8/20   [facility] Adult PT Treatment:                                                DATE: 11/17/23 Therapeutic Exercise: *** Manual Therapy: *** Neuromuscular re-ed: *** Therapeutic Activity: *** Gait: *** Modalities: *** Self Care: [facility] Adult PT Treatment:                                                DATE: 10/27/23 Therapeutic Exercise: Sit<>stand x 5 reps Discussed home safety and using a chair with arm rests Discussed PT plan and engagement with HEP  PATIENT EDUCATION: Education details: HEP, PT plan of care Person educated: Patient Education method: Explanation, Demonstration, and Handouts Education comprehension: verbalized understanding and needs further education  HOME EXERCISE PROGRAM: Access Code: [email or web address] URL: [email or web address] Date: 10/27/2023 Prepared by: Tawni Ferrier  Exercises - Sit to Stand with Counter Support  - 2 x daily - 7 x weekly - 1 sets - 10 reps  GOALS: Goals reviewed with patient? Yes  SHORT TERM GOALS: Target date: 11/25/23  The patient will be indep with initial HEP Baseline: initiated at eval Goal status: INITIAL  2.  The patient will improve Berg to > or equal to 25/56   Baseline:  19/56 Goal status: INITIAL  3.  The patient will decrease 5 time sit<>stand to < or equal to 30 seconds Baseline: 37.5 seconds Goal status: INITIAL  LONG TERM GOALS: Target date: 12/26/23  The patient will be indep with progression of HEP. Baseline:  initiated at eval Goal status: INITIAL  2.  The patient will improve PSFS from 8/20 up to 10/20 to demonstrate improved functional  abilities. Baseline: 8/20 Goal status: INITIAL  3.  The patient will improve Berg to > or equal to 30/56. Baseline:  19/56 Goal status: INITIAL  4.  The patient will perform 5 time sit<>stand in < or equal to 25 seconds Baseline:  37.5 seconds Goal status: INITIAL  5.  The patient will improve TUG score and goal to follow. Baseline:  TBA Goal status: INITIAL  ASSESSMENT:  CLINICAL IMPRESSION: Patient is an 83 y.o. female who was seen today for physical therapy evaluation and treatment for muscle weakness and imbalance. The patient presents with LE weakness, slowed gait speed, dec'd balance, trunk weakness, and overall decline in functional status. PT to address deficits to promote improved mobility in the home and reduced risk for falls.   OBJECTIVE IMPAIRMENTS: Abnormal gait, decreased activity tolerance, decreased balance, decreased mobility, difficulty walking, decreased strength, dizziness, increased fascial restrictions, impaired flexibility, impaired sensation, and postural dysfunction.    PLAN:  PT FREQUENCY: 2x/week  PT DURATION: 8 weeks  PLANNED INTERVENTIONS: [id number]- PT Re-evaluation, 97750- Physical Performance Testing, 97110-Therapeutic exercises, 97530- Therapeutic activity, [id number]- Neuromuscular re-education, 97535- Self Care, [id number]- Manual therapy, [phone]- Gait training, Patient/Family  education, Location manager, Stair training, and Vestibular training  PLAN FOR NEXT SESSION: Assess TUG test, add more HEP for LE strength/balance (consider [place] program for home), gait activities.   Jisele Price, PT 11/17/2023, 9:26 AM

## 2023-12-08 ENCOUNTER — Ambulatory Visit: Admitting: Rehabilitative and Restorative Service Providers"

## 2023-12-09 DIAGNOSIS — I454 Nonspecific intraventricular block: Secondary | ICD-10-CM | POA: Diagnosis not present

## 2023-12-09 DIAGNOSIS — I2510 Atherosclerotic heart disease of native coronary artery without angina pectoris: Secondary | ICD-10-CM | POA: Diagnosis not present

## 2023-12-09 DIAGNOSIS — I10 Essential (primary) hypertension: Secondary | ICD-10-CM | POA: Diagnosis not present

## 2023-12-09 DIAGNOSIS — I471 Supraventricular tachycardia, unspecified: Secondary | ICD-10-CM | POA: Diagnosis not present

## 2023-12-09 DIAGNOSIS — Z8673 Personal history of transient ischemic attack (TIA), and cerebral infarction without residual deficits: Secondary | ICD-10-CM | POA: Diagnosis not present

## 2023-12-09 DIAGNOSIS — I447 Left bundle-branch block, unspecified: Secondary | ICD-10-CM | POA: Diagnosis not present

## 2023-12-09 DIAGNOSIS — Z789 Other specified health status: Secondary | ICD-10-CM | POA: Diagnosis not present

## 2023-12-09 DIAGNOSIS — E782 Mixed hyperlipidemia: Secondary | ICD-10-CM | POA: Diagnosis not present

## 2023-12-21 DIAGNOSIS — J849 Interstitial pulmonary disease, unspecified: Secondary | ICD-10-CM | POA: Diagnosis not present

## 2023-12-21 DIAGNOSIS — R0602 Shortness of breath: Secondary | ICD-10-CM | POA: Diagnosis not present

## 2023-12-29 ENCOUNTER — Other Ambulatory Visit: Payer: Self-pay | Admitting: Medical-Surgical

## 2023-12-29 DIAGNOSIS — I10 Essential (primary) hypertension: Secondary | ICD-10-CM

## 2024-01-04 DIAGNOSIS — K219 Gastro-esophageal reflux disease without esophagitis: Secondary | ICD-10-CM | POA: Diagnosis not present

## 2024-01-04 DIAGNOSIS — R197 Diarrhea, unspecified: Secondary | ICD-10-CM | POA: Diagnosis not present

## 2024-01-04 DIAGNOSIS — J849 Interstitial pulmonary disease, unspecified: Secondary | ICD-10-CM | POA: Diagnosis not present

## 2024-01-04 DIAGNOSIS — R634 Abnormal weight loss: Secondary | ICD-10-CM | POA: Diagnosis not present

## 2024-01-04 DIAGNOSIS — R195 Other fecal abnormalities: Secondary | ICD-10-CM | POA: Diagnosis not present

## 2024-01-04 DIAGNOSIS — K869 Disease of pancreas, unspecified: Secondary | ICD-10-CM | POA: Diagnosis not present

## 2024-01-04 DIAGNOSIS — R110 Nausea: Secondary | ICD-10-CM | POA: Diagnosis not present

## 2024-01-12 DIAGNOSIS — J849 Interstitial pulmonary disease, unspecified: Secondary | ICD-10-CM | POA: Diagnosis not present

## 2024-01-12 DIAGNOSIS — J9859 Other diseases of mediastinum, not elsewhere classified: Secondary | ICD-10-CM | POA: Diagnosis not present

## 2024-01-21 DIAGNOSIS — R634 Abnormal weight loss: Secondary | ICD-10-CM | POA: Diagnosis not present

## 2024-01-21 DIAGNOSIS — R110 Nausea: Secondary | ICD-10-CM | POA: Diagnosis not present

## 2024-01-21 DIAGNOSIS — K8689 Other specified diseases of pancreas: Secondary | ICD-10-CM | POA: Diagnosis not present

## 2024-01-27 DIAGNOSIS — R197 Diarrhea, unspecified: Secondary | ICD-10-CM | POA: Diagnosis not present

## 2024-01-27 DIAGNOSIS — I951 Orthostatic hypotension: Secondary | ICD-10-CM | POA: Diagnosis not present

## 2024-01-27 DIAGNOSIS — Z853 Personal history of malignant neoplasm of breast: Secondary | ICD-10-CM | POA: Diagnosis not present

## 2024-01-27 DIAGNOSIS — Z743 Need for continuous supervision: Secondary | ICD-10-CM | POA: Diagnosis not present

## 2024-01-27 DIAGNOSIS — R1084 Generalized abdominal pain: Secondary | ICD-10-CM | POA: Diagnosis not present

## 2024-01-27 DIAGNOSIS — Z9013 Acquired absence of bilateral breasts and nipples: Secondary | ICD-10-CM | POA: Diagnosis not present

## 2024-01-27 DIAGNOSIS — I454 Nonspecific intraventricular block: Secondary | ICD-10-CM | POA: Diagnosis not present

## 2024-01-27 DIAGNOSIS — R42 Dizziness and giddiness: Secondary | ICD-10-CM | POA: Diagnosis not present

## 2024-02-29 ENCOUNTER — Ambulatory Visit: Payer: Self-pay

## 2024-02-29 DIAGNOSIS — Z91041 Radiographic dye allergy status: Secondary | ICD-10-CM | POA: Diagnosis not present

## 2024-02-29 DIAGNOSIS — Z87892 Personal history of anaphylaxis: Secondary | ICD-10-CM | POA: Diagnosis not present

## 2024-02-29 DIAGNOSIS — Z87440 Personal history of urinary (tract) infections: Secondary | ICD-10-CM | POA: Diagnosis not present

## 2024-02-29 DIAGNOSIS — Z8673 Personal history of transient ischemic attack (TIA), and cerebral infarction without residual deficits: Secondary | ICD-10-CM | POA: Diagnosis not present

## 2024-02-29 DIAGNOSIS — R42 Dizziness and giddiness: Secondary | ICD-10-CM | POA: Diagnosis not present

## 2024-02-29 DIAGNOSIS — J4540 Moderate persistent asthma, uncomplicated: Secondary | ICD-10-CM | POA: Diagnosis not present

## 2024-02-29 DIAGNOSIS — K921 Melena: Secondary | ICD-10-CM | POA: Diagnosis not present

## 2024-02-29 DIAGNOSIS — R531 Weakness: Secondary | ICD-10-CM | POA: Diagnosis not present

## 2024-02-29 DIAGNOSIS — J8489 Other specified interstitial pulmonary diseases: Secondary | ICD-10-CM | POA: Diagnosis not present

## 2024-02-29 DIAGNOSIS — R110 Nausea: Secondary | ICD-10-CM | POA: Diagnosis not present

## 2024-02-29 DIAGNOSIS — J4530 Mild persistent asthma, uncomplicated: Secondary | ICD-10-CM | POA: Diagnosis not present

## 2024-02-29 DIAGNOSIS — R1310 Dysphagia, unspecified: Secondary | ICD-10-CM | POA: Diagnosis not present

## 2024-02-29 DIAGNOSIS — R197 Diarrhea, unspecified: Secondary | ICD-10-CM | POA: Diagnosis not present

## 2024-02-29 DIAGNOSIS — R112 Nausea with vomiting, unspecified: Secondary | ICD-10-CM | POA: Diagnosis not present

## 2024-02-29 DIAGNOSIS — K760 Fatty (change of) liver, not elsewhere classified: Secondary | ICD-10-CM | POA: Diagnosis not present

## 2024-02-29 DIAGNOSIS — Z888 Allergy status to other drugs, medicaments and biological substances status: Secondary | ICD-10-CM | POA: Diagnosis not present

## 2024-02-29 DIAGNOSIS — Z743 Need for continuous supervision: Secondary | ICD-10-CM | POA: Diagnosis not present

## 2024-02-29 DIAGNOSIS — Z853 Personal history of malignant neoplasm of breast: Secondary | ICD-10-CM | POA: Diagnosis not present

## 2024-02-29 DIAGNOSIS — R55 Syncope and collapse: Secondary | ICD-10-CM | POA: Diagnosis not present

## 2024-02-29 DIAGNOSIS — R634 Abnormal weight loss: Secondary | ICD-10-CM | POA: Diagnosis not present

## 2024-02-29 DIAGNOSIS — R918 Other nonspecific abnormal finding of lung field: Secondary | ICD-10-CM | POA: Diagnosis not present

## 2024-02-29 DIAGNOSIS — K449 Diaphragmatic hernia without obstruction or gangrene: Secondary | ICD-10-CM | POA: Diagnosis not present

## 2024-02-29 DIAGNOSIS — J479 Bronchiectasis, uncomplicated: Secondary | ICD-10-CM | POA: Diagnosis not present

## 2024-02-29 DIAGNOSIS — J849 Interstitial pulmonary disease, unspecified: Secondary | ICD-10-CM | POA: Diagnosis not present

## 2024-02-29 DIAGNOSIS — K5730 Diverticulosis of large intestine without perforation or abscess without bleeding: Secondary | ICD-10-CM | POA: Diagnosis not present

## 2024-02-29 DIAGNOSIS — D120 Benign neoplasm of cecum: Secondary | ICD-10-CM | POA: Diagnosis not present

## 2024-02-29 DIAGNOSIS — R1084 Generalized abdominal pain: Secondary | ICD-10-CM | POA: Diagnosis not present

## 2024-02-29 DIAGNOSIS — K5909 Other constipation: Secondary | ICD-10-CM | POA: Diagnosis not present

## 2024-02-29 DIAGNOSIS — Z87891 Personal history of nicotine dependence: Secondary | ICD-10-CM | POA: Diagnosis not present

## 2024-02-29 DIAGNOSIS — K529 Noninfective gastroenteritis and colitis, unspecified: Secondary | ICD-10-CM | POA: Diagnosis not present

## 2024-02-29 DIAGNOSIS — E782 Mixed hyperlipidemia: Secondary | ICD-10-CM | POA: Diagnosis not present

## 2024-02-29 DIAGNOSIS — I447 Left bundle-branch block, unspecified: Secondary | ICD-10-CM | POA: Diagnosis not present

## 2024-02-29 DIAGNOSIS — G8929 Other chronic pain: Secondary | ICD-10-CM | POA: Diagnosis not present

## 2024-02-29 DIAGNOSIS — K219 Gastro-esophageal reflux disease without esophagitis: Secondary | ICD-10-CM | POA: Diagnosis not present

## 2024-02-29 DIAGNOSIS — N3001 Acute cystitis with hematuria: Secondary | ICD-10-CM | POA: Diagnosis not present

## 2024-02-29 DIAGNOSIS — I10 Essential (primary) hypertension: Secondary | ICD-10-CM | POA: Diagnosis not present

## 2024-02-29 DIAGNOSIS — K5289 Other specified noninfective gastroenteritis and colitis: Secondary | ICD-10-CM | POA: Diagnosis not present

## 2024-02-29 DIAGNOSIS — K648 Other hemorrhoids: Secondary | ICD-10-CM | POA: Diagnosis not present

## 2024-02-29 DIAGNOSIS — D123 Benign neoplasm of transverse colon: Secondary | ICD-10-CM | POA: Diagnosis not present

## 2024-02-29 NOTE — Telephone Encounter (Signed)
 FYI Only or Action Required?: FYI only for provider.  Patient was last seen in primary care on 10/07/2023 by Courtney Mini, NP.  Called Nurse Triage reporting Loss of Consciousness.  Symptoms began not sure.  Interventions attempted: Nothing.  Symptoms are: rapidly worsening.  Triage Disposition: Call EMS 911 Now  Patient/caregiver understands and will follow disposition?: YesCopied from CRM [id number]. Topic: Clinical - Red Word Triage >> Feb 29, 2024 11:35 AM Winona SAUNDERS wrote: Pt daughter [name] calling with concerns about her mom, Mom feels week and has been passing out over the last few days along with stomach pain, vomiting, diarrhea, bloating . Pt went to ER 09/11 for stomach pain, the hospital would like for the pt to have a colonoscopy done but the pt can not prep for it as she lives alone and is not stable enough. Reason for Disposition  Sounds like a life-threatening emergency to the triager  Answer Assessment - Initial Assessment Questions Daughter [name] called with concerns. Lots of black out spells. I know she's falling but wont' tell me. She's by herself.She won't tell me details because she doesn't want me to worry. ER doc told her she needs to have colonoscopy due to concerns from CT Scan. She can't do the prep alone. When she eats she gets nauseated/vomited/diarrhea. She can't move without  feeling like blacking out.She has to be so weak.   [name] recently had surgery and can't get to pt's house. She's only able to talk to her. RN  called 911. Paige is going to try to make up to pt in time for EMS.    1. ONSET: How long were you unconscious? (e.g., minutes, seconds) When did it happen?     unsure 2. CONTENT: What happened during the period of unconsciousness? (e.g., seizure activity)      unsure 3. MENTAL STATUS: Alert and oriented now? (e.g., oriented x 3 = name, month, location)      unsure 4. TRIGGER: What do you think caused the fainting? What were you doing  just before you fainted?  (e.g., exercise, sudden standing up, prolonged standing)     unsure 5. RECURRENT SYMPTOM: Have you ever passed out before? If Yes, ask: When was the last time? and What happened that time?      unsure 6. INJURY: Did you hurt yourself when you fell?      unsure 7. CARDIAC SYMPTOMS: Have you had any of the following symptoms: chest pain, difficulty breathing, palpitations?     unsure 8. NEUROLOGIC SYMPTOMS: Have you had any of the following symptoms: headache, numbness, vertigo, weakness?     unsure 9. GI SYMPTOMS: Have you had any of the following symptoms: abdomen pain, vomiting, diarrhea, blood in stools?     Vomiting and diarrhea 10. OTHER SYMPTOMS: Do you have any other symptoms?  Protocols used: Everlie Eble's

## 2024-03-01 DIAGNOSIS — I10 Essential (primary) hypertension: Secondary | ICD-10-CM | POA: Diagnosis not present

## 2024-03-01 DIAGNOSIS — J849 Interstitial pulmonary disease, unspecified: Secondary | ICD-10-CM | POA: Diagnosis not present

## 2024-03-01 DIAGNOSIS — N3001 Acute cystitis with hematuria: Secondary | ICD-10-CM | POA: Diagnosis not present

## 2024-03-01 DIAGNOSIS — K921 Melena: Secondary | ICD-10-CM | POA: Diagnosis not present

## 2024-03-01 DIAGNOSIS — J4530 Mild persistent asthma, uncomplicated: Secondary | ICD-10-CM | POA: Diagnosis not present

## 2024-03-01 DIAGNOSIS — I447 Left bundle-branch block, unspecified: Secondary | ICD-10-CM | POA: Diagnosis not present

## 2024-03-01 DIAGNOSIS — R1310 Dysphagia, unspecified: Secondary | ICD-10-CM | POA: Diagnosis not present

## 2024-03-01 DIAGNOSIS — J479 Bronchiectasis, uncomplicated: Secondary | ICD-10-CM | POA: Diagnosis not present

## 2024-03-01 DIAGNOSIS — R634 Abnormal weight loss: Secondary | ICD-10-CM | POA: Diagnosis not present

## 2024-03-01 DIAGNOSIS — Z853 Personal history of malignant neoplasm of breast: Secondary | ICD-10-CM | POA: Diagnosis not present

## 2024-03-01 DIAGNOSIS — K219 Gastro-esophageal reflux disease without esophagitis: Secondary | ICD-10-CM | POA: Diagnosis not present

## 2024-03-01 DIAGNOSIS — R112 Nausea with vomiting, unspecified: Secondary | ICD-10-CM | POA: Diagnosis not present

## 2024-03-01 DIAGNOSIS — K529 Noninfective gastroenteritis and colitis, unspecified: Secondary | ICD-10-CM | POA: Diagnosis not present

## 2024-03-06 ENCOUNTER — Telehealth: Payer: Self-pay

## 2024-03-06 NOTE — Transitions of Care (Post Inpatient/ED Visit) (Signed)
 03/06/2024  Name: Courtney Cummings MRN: [id number] DOB: 1941/05/13  Today's TOC FU Call Status: Today's TOC FU Call Status:: Successful TOC FU Call Completed TOC FU Call Complete Date: 03/06/24 Patient's Name and Date of Birth confirmed.  Transition Care Management Follow-up Telephone Call Date of Discharge: 03/03/24 Discharge Facility: Other [facility]) Name of Other (Non[facility]) Discharge Facility: [facility] Type of Discharge: Inpatient Admission Primary Inpatient Discharge Diagnosis:: cystitis How have you been since you were released from the hospital?: Better Any questions or concerns?: No  Items Reviewed: Did you receive and understand the discharge instructions provided?: Yes Medications obtained,verified, and reconciled?: Yes (Medications Reviewed) Any new allergies since your discharge?: No Dietary orders reviewed?: Yes Do you have support at home?: Yes People in Home [RPT]: child(ren), adult  Medications Reviewed Today: Medications Reviewed Today     Reviewed by Emmitt Pan, LPN (Licensed Practical Nurse) on 03/06/24 at 0940  Med List Status: <None>   Medication Order Taking? Sig Documenting Provider Last Dose Status Informant  acetaminophen (TYLENOL) 325 MG tablet [id number] Yes Take 650 mg by mouth every 6 (six) hours as needed for pain or headache. [provider]  Active   albuterol  (PROVENTIL ) (2.5 MG/3ML) 0.083% nebulizer solution [id number] Yes Take 3 mLs (2.5 mg total) by nebulization every 4 (four) hours as needed for wheezing or shortness of breath (please include nebulizer machine, hoses, and mask if needed.). Willo Mini, NP  Active   albuterol  (VENTOLIN  HFA) 108 (90 Base) MCG/ACT inhaler [id number] Yes INHALE 2 PUFFS BY MOUTH EVERY 4 HOURS AS NEEDED FOR WHEEZING FOR SHORTNESS OF SHERIDA Willo, Joy, NP  Active   amLODipine  (NORVASC ) 2.5 MG tablet [id number] Yes Take 1 tablet (2.5 mg total) by mouth daily. NEEDS APPOINTMENT FOR FURTHER REFILLS. Willo Mini, NP  Active   diclofenac  Sodium (VOLTAREN ) 1 % GEL [id number] Yes Apply 4 g topically 4 (four) times daily. To affected joint. Antoniette Vermell CROME, PA-C  Active   dicyclomine  (BENTYL ) 10 MG capsule [id number] Yes TAKE 1 CAPSULE BY MOUTH 4 TIMES DAILY BEFORE MEAL(S) AND AT BEDTIME Willo Mini, NP  Active   docusate sodium (COLACE) 100 MG capsule [id number] Yes Take 100 mg by mouth daily as needed for mild constipation. [provider]  Active   ibuprofen (ADVIL) 200 MG tablet [id number] Yes Take 200 mg by mouth every 6 (six) hours as needed. 1-2 tabs prn [provider]  Active   LORazepam  (ATIVAN ) 1 MG tablet [id number] Yes Take 1 tablet (1 mg total) by mouth 3 (three) times daily as needed (Use very sparingly). Willo Mini, NP  Active   meclizine  (ANTIVERT ) 25 MG tablet [id number] Yes Take 1 tablet (25 mg total) by mouth 3 (three) times daily as needed for dizziness or nausea. Willo Mini, NP  Active   Multiple Vitamin (MULTI-VITAMIN) tablet [id number] Yes Take 1 tablet by mouth daily. [provider]  Active   omeprazole  (PRILOSEC) 40 MG capsule [id number] Yes TAKE 1 CAPSULE(40 MG) BY MOUTH DAILY Jessup, Joy, NP  Active   ondansetron  (ZOFRAN -ODT) 4 MG disintegrating tablet [id number] Yes Take 1 tablet (4 mg total) by mouth every 8 (eight) hours as needed for nausea or vomiting. Willo Mini, NP  Active   sertraline  (ZOLOFT ) 50 MG tablet [id number] Yes Take 50mg  (1 tablet) every morning and 25mg  (1/2 tablet) every evening for 1 week then increase to 50mg  twice daily. Willo Mini, NP  Active   traMADol  (ULTRAM ) 50 MG tablet [id number] Yes Take 1 tablet (50  mg total) by mouth every 6 (six) hours as needed. Willo Mini, NP  Active             Home Care and Equipment/Supplies: Were Home Health Services Ordered?: NA Any new equipment or medical supplies ordered?: NA  Functional Questionnaire: Do you need assistance with bathing/showering or dressing?: Yes Do you need assistance  with meal preparation?: No Do you need assistance with eating?: No Do you have difficulty maintaining continence: Yes Do you need assistance with getting out of bed/getting out of a chair/moving?: No Do you have difficulty managing or taking your medications?: Yes  Follow up appointments reviewed: PCP Follow-up appointment confirmed?: Yes Date of PCP follow-up appointment?: 03/14/24 Follow-up Provider: [facility] Follow-up appointment confirmed?: NA Do you need transportation to your follow-up appointment?: No Do you understand care options if your condition(s) worsen?: Yes-patient verbalized understanding    SIGNATURE Julian Lemmings, LPN [facility] Nurse Health Advisor Direct Dial [phone]

## 2024-03-14 ENCOUNTER — Ambulatory Visit (INDEPENDENT_AMBULATORY_CARE_PROVIDER_SITE_OTHER): Admitting: Medical-Surgical

## 2024-03-14 ENCOUNTER — Encounter: Payer: Self-pay | Admitting: Medical-Surgical

## 2024-03-14 VITALS — BP 116/74 | HR 66 | Resp 20 | Ht 67.0 in | Wt 162.0 lb

## 2024-03-14 DIAGNOSIS — R110 Nausea: Secondary | ICD-10-CM

## 2024-03-14 DIAGNOSIS — N309 Cystitis, unspecified 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: Secondary | ICD-10-CM | POA: Diagnosis not present

## 2024-03-14 DIAGNOSIS — R197 Diarrhea, unspecified: Secondary | ICD-10-CM

## 2024-03-14 DIAGNOSIS — Z09 Encounter for follow-up examination after completed treatment for conditions other than malignant neoplasm: Secondary | ICD-10-CM

## 2024-03-14 MED ORDER — ONDANSETRON 4 MG PO TBDP: 4.0000 mg | ORAL_TABLET | Freq: Three times a day (TID) | ORAL | 0 refills | Status: AC | PRN

## 2024-03-14 MED ORDER — LOPERAMIDE HCL 2 MG PO TABS: 2.0000 mg | ORAL_TABLET | Freq: Four times a day (QID) | ORAL | 0 refills | Status: AC | PRN

## 2024-03-14 MED ORDER — VITAMIN B-12 1000 MCG PO TABS: 1000.0000 ug | ORAL_TABLET | Freq: Every day | ORAL | 3 refills | Status: AC

## 2024-03-14 MED ORDER — OMEPRAZOLE 40 MG PO CPDR: DELAYED_RELEASE_CAPSULE | ORAL | 1 refills | Status: AC

## 2024-03-14 NOTE — Progress Notes (Signed)
 Established Patient Office Visit  Subjective   Patient ID: Courtney Cummings, female    DOB: [birth year]  Age: 83 y.o. MRN: [id number]  No chief complaint on file.   HPI Zyra an 83 year old female presents to to the office today for a hospital follow up after diagnosis of cystitis, dizziness, and n/v/d. Patient was seen by GI in the hospital. EGD and colonoscopy performed without worrisome findings. Biopsies were taken and patient is to follow up with GI to discuss results. Patient does not have an appt at this time, but will call to schedule. She was discharged with cefdinir 300 mg for treatment of cystitis. Patient completed antibiotic last week. She denies having any current UTI symptoms. She reports continued nausea and mild diarrhea, but denies vomiting. Patient states that she did not pick up the previously prescribed Zofran  that was sent in May. Patient states that she did receive Zofran  in the hospital with good relief. She does use loperamide 2 mg over the counter as needed. She would like this sent to the pharmacy to see if it is covered by her insurance.   Review of Systems  Constitutional: Negative.   HENT: Negative.    Eyes: Negative.   Respiratory: Negative.    Cardiovascular: Negative.   Gastrointestinal: Negative.   Genitourinary: Negative.   Musculoskeletal: Negative.   Skin: Negative.   Neurological: Negative.   Endo/Heme/Allergies: Negative.   Psychiatric/Behavioral: Negative.        Objective:     There were no vitals taken for this visit. BP Readings from Last 3 Encounters:  10/07/23 128/77  08/27/23 113/67  02/16/23 117/74      Physical Exam Vitals and nursing note reviewed.  Constitutional:      General: She is not in acute distress.    Appearance: Normal appearance.  Cardiovascular:     Rate and Rhythm: Normal rate and regular rhythm.     Pulses: Normal pulses.     Heart sounds: Normal heart sounds.  Pulmonary:     Effort: Pulmonary effort is  normal.     Breath sounds: Normal breath sounds.  Neurological:     General: No focal deficit present.     Mental Status: She is alert and oriented to person, place, and time.  Psychiatric:        Mood and Affect: Mood normal.        Behavior: Behavior normal.        Thought Content: Thought content normal.        Judgment: Judgment normal.      No results found for any visits on 03/14/24.  Last metabolic panel Lab Results  Component Value Date   GLUCOSE 107 (H) 08/27/2023   NA 140 08/27/2023   K 3.7 08/27/2023   CL 106 08/27/2023   CO2 19 (L) 08/27/2023   BUN 14 08/27/2023   CREATININE 0.81 08/27/2023   EGFR 73 08/27/2023   CALCIUM 9.1 08/27/2023   PROT 6.8 08/27/2023   ALBUMIN 4.3 08/27/2023   LABGLOB 2.5 08/27/2023   BILITOT 0.4 08/27/2023   ALKPHOS 120 08/27/2023   AST 25 08/27/2023   ALT 27 08/27/2023   ANIONGAP 8 11/20/2022      The ASCVD Risk score (Arnett DK, et al., 2019) failed to calculate for the following reasons:   The 2019 ASCVD risk score is only valid for ages 38 to 82   Risk score cannot be calculated because patient has a medical history suggesting prior/existing ASCVD  Assessment & Plan:   1. Hospital discharge follow-up (Primary) -Discharge summary, notes, and plan reviewed  -Patient to follow up with GI to discuss results of colonoscopy biopsies -Complete resolution of cystitis -Follow up if symptoms return  2. Cystitis -Patient completed antibiotic. -Follow up if symptoms return  3. Nausea -Refill zofran  and omeprazole  for nausea -Follow up with GI  4. Diarrhea, unspecified type -Continue to take loperamide 2 mg as needed for diarrhea -Patient to call GI to schedule follow up to discuss biopsy results  Derrek Freund, NP Student

## 2024-03-14 NOTE — Progress Notes (Signed)
 Medical screening examination/treatment was performed by qualified clinical staff member and as supervising provider I was immediately available for consultation/collaboration. I have reviewed documentation and agree with assessment and plan.  Thayer Ohm, DNP, APRN, FNP-BC [facility]

## 2024-03-30 ENCOUNTER — Other Ambulatory Visit: Payer: Self-pay | Admitting: Medical-Surgical

## 2024-03-30 DIAGNOSIS — I10 Essential (primary) hypertension: Secondary | ICD-10-CM

## 2024-04-29 ENCOUNTER — Other Ambulatory Visit: Payer: Self-pay | Admitting: Medical-Surgical

## 2024-04-29 DIAGNOSIS — I10 Essential (primary) hypertension: Secondary | ICD-10-CM

## 2024-05-22 ENCOUNTER — Other Ambulatory Visit: Payer: Self-pay | Admitting: Medical-Surgical

## 2024-05-22 NOTE — Telephone Encounter (Signed)
 Last filled 08/27/2023  Last OV 03/14/2024  Upcoming appointment 06/19/2024

## 2024-06-08 ENCOUNTER — Telehealth: Payer: Self-pay

## 2024-06-08 DIAGNOSIS — F32A Depression, unspecified: Secondary | ICD-10-CM

## 2024-06-08 DIAGNOSIS — F419 Anxiety disorder, unspecified: Secondary | ICD-10-CM

## 2024-06-08 NOTE — Telephone Encounter (Signed)
 Copied from CRM #[id number]. Topic: Clinical - Medication Refill >> Jun 08, 2024 12:06 PM Montie POUR wrote: Medication:  sertraline  (ZOLOFT ) 50 MG tablet   Has the patient contacted their pharmacy? Yes (Agent: If no, request that the patient contact the pharmacy for the refill. If patient does not wish to contact the pharmacy document the reason why and proceed with request.) (Agent: If yes, when and what did the pharmacy advise?) Pharmacy needs an order to refill  This is the patient's preferred pharmacy:  [id number] - [place], [place] - [address] [place] [address] Phone: [phone] Fax: [phone]  Is this the correct pharmacy for this prescription? Yes If no, delete pharmacy and type the correct one.   Has the prescription been filled recently? No  Is the patient out of the medication? Yes - She has been out for a few days  Has the patient been seen for an appointment in the last year OR does the patient have an upcoming appointment? Yes  Can we respond through MyChart? No  Agent: Please be advised that Rx refills may take up to 3 business days. We ask that you follow-up with your pharmacy.

## 2024-06-09 MED ORDER — SERTRALINE HCL 50 MG PO TABS: ORAL_TABLET | ORAL | 1 refills | Status: AC

## 2024-06-18 ENCOUNTER — Other Ambulatory Visit: Payer: Self-pay | Admitting: Medical-Surgical

## 2024-06-18 DIAGNOSIS — I10 Essential (primary) hypertension: Secondary | ICD-10-CM

## 2024-06-19 ENCOUNTER — Ambulatory Visit: Admitting: Medical-Surgical

## 2024-07-14 ENCOUNTER — Ambulatory Visit: Admitting: Medical-Surgical
